# Patient Record
Sex: Female | Born: 1965 | Race: Black or African American | Hispanic: No | Smoking: Former smoker
Health system: Southern US, Community
[De-identification: ages and names within clinical notes are randomized; demographics above are authoritative.]

## PROBLEM LIST (undated history)

## (undated) DIAGNOSIS — M1990 Unspecified osteoarthritis, unspecified site: Secondary | ICD-10-CM

## (undated) DIAGNOSIS — I10 Essential (primary) hypertension: Secondary | ICD-10-CM

## (undated) DIAGNOSIS — D649 Anemia, unspecified: Secondary | ICD-10-CM

## (undated) DIAGNOSIS — T7840XA Allergy, unspecified, initial encounter: Secondary | ICD-10-CM

## (undated) DIAGNOSIS — C50919 Malignant neoplasm of unspecified site of unspecified female breast: Secondary | ICD-10-CM

## (undated) DIAGNOSIS — J45909 Unspecified asthma, uncomplicated: Secondary | ICD-10-CM

## (undated) DIAGNOSIS — F419 Anxiety disorder, unspecified: Secondary | ICD-10-CM

## (undated) DIAGNOSIS — Z800 Family history of malignant neoplasm of digestive organs: Secondary | ICD-10-CM

## (undated) HISTORY — DX: Family history of malignant neoplasm of digestive organs: Z80.0

## (undated) HISTORY — DX: Malignant neoplasm of unspecified site of unspecified female breast: C50.919

## (undated) HISTORY — PX: ABDOMINAL HYSTERECTOMY: SHX81

## (undated) HISTORY — DX: Essential (primary) hypertension: I10

## (undated) HISTORY — DX: Unspecified asthma, uncomplicated: J45.909

## (undated) HISTORY — DX: Anxiety disorder, unspecified: F41.9

## (undated) HISTORY — DX: Allergy, unspecified, initial encounter: T78.40XA

## (undated) HISTORY — DX: Unspecified osteoarthritis, unspecified site: M19.90

## (undated) HISTORY — DX: Anemia, unspecified: D64.9

---

## 2013-10-10 DIAGNOSIS — C539 Malignant neoplasm of cervix uteri, unspecified: Secondary | ICD-10-CM

## 2013-10-10 HISTORY — DX: Malignant neoplasm of cervix uteri, unspecified: C53.9

## 2019-10-11 DIAGNOSIS — C50919 Malignant neoplasm of unspecified site of unspecified female breast: Secondary | ICD-10-CM

## 2019-10-11 HISTORY — DX: Malignant neoplasm of unspecified site of unspecified female breast: C50.919

## 2019-12-25 ENCOUNTER — Other Ambulatory Visit: Payer: Self-pay

## 2019-12-25 DIAGNOSIS — N644 Mastodynia: Secondary | ICD-10-CM

## 2019-12-25 DIAGNOSIS — M79621 Pain in right upper arm: Secondary | ICD-10-CM

## 2019-12-25 DIAGNOSIS — M79622 Pain in left upper arm: Secondary | ICD-10-CM

## 2020-01-02 ENCOUNTER — Ambulatory Visit: Payer: Self-pay | Admitting: Student

## 2020-01-02 ENCOUNTER — Other Ambulatory Visit: Payer: Self-pay

## 2020-01-02 ENCOUNTER — Encounter: Payer: Self-pay | Admitting: Student

## 2020-01-02 VITALS — BP 122/78 | Temp 97.3°F | Wt 186.0 lb

## 2020-01-02 DIAGNOSIS — Z01419 Encounter for gynecological examination (general) (routine) without abnormal findings: Secondary | ICD-10-CM

## 2020-01-02 DIAGNOSIS — N644 Mastodynia: Secondary | ICD-10-CM

## 2020-01-02 NOTE — Progress Notes (Signed)
Ms. Veronica Conrad is a 54 y.o. G3P0 female who presents to [facility] clinic today with complaint of bilateral breast pain intermittently for the last 2 months. Also reports clear nipple discharge from both breasts x 20 years.  Reports having a total hysterectomy in 2016 due to cervical cancer. This was done at [facility] in [place]. Also received radiation & chemotherapy as treatment. Has not had any pap smears since then. (Will request records for previous treatment).   Pap Smear: Pap smear completed today. Last Pap smear was prior to 2016 in [place] and was abnormal - "stage 4 cervical cancer". Per patient has history of an abnormal Pap smear. Last Pap smear result is not available in Epic.   Physical exam: Breasts Breasts symmetrical. No skin abnormalities bilateral breasts. No nipple retraction bilateral breasts. No nipple discharge bilateral breasts. No lymphadenopathy. No lumps palpated bilateral breasts.       Pelvic/Bimanual Ext Genitalia No lesions, no swelling and no discharge observed on external genitalia.        Vagina Vagina pink and normal texture. No lesions or discharge observed in vagina.        Cervix Cervix is present. Cervix pink and of normal texture. No discharge observed.    Uterus Uterus is present and palpable. Uterus in normal position and normal size.        Adnexae Bilateral ovaries present and palpable. No tenderness on palpation.         Rectovaginal No rectal exam completed today since patient had no rectal complaints. No skin abnormalities observed on exam.     Smoking History: Patient has is a former smoker.    Patient Navigation: Patient education provided. Access to services provided for patient through [facility] program. Transportation provided   Colorectal Cancer Screening: Per patient has had colonoscopy completed on 2016. No complaints today.    Breast and Cervical Cancer Risk Assessment: Patient does not have family  history of breast cancer, known genetic mutations, or radiation treatment to the chest before age 85. Patient has history of cervical dysplasia, immunocompromised, or DES exposure in-utero.  Risk Assessment    Risk Scores      01/02/2020   Last edited by: Demetrius Revel, LPN   5-year risk: 0.9 %   Lifetime risk: 6.6 %          A: BCCCP exam with pap smear Complaint of breast pain  P: Referred patient to the [facility] for a diagnostic mammogram. Appointment scheduled 01/09/2020.  Jorje Guild, NP 01/02/2020 1:18 PM

## 2020-01-06 ENCOUNTER — Telehealth: Payer: Self-pay

## 2020-01-06 LAB — CYTOLOGY - PAP
Comment: NEGATIVE
Diagnosis: UNDETERMINED — AB
High risk HPV: NEGATIVE

## 2020-01-06 NOTE — Progress Notes (Signed)
This patient has a history of uterine cancer, had a hysterectomy in 2015. What are her recommendations? Thank you, ] Marjory Lies, LPN

## 2020-01-06 NOTE — Telephone Encounter (Signed)
Records release refaxed to [facility], [phone]) AttnShela Leff or Ms. Weaver. Initially faxed on 01/03/2020, fax failed.

## 2020-01-07 NOTE — Progress Notes (Signed)
Thank you. We are trying to obtain her medical records to see whether or not she had cervical cancer.

## 2020-01-08 NOTE — Progress Notes (Signed)
Per Jerene Pitch, patient stated at ov that she had Stage 4 Cervical Cancer. Does this change any recommendations?

## 2020-01-09 ENCOUNTER — Ambulatory Visit
Admission: RE | Admit: 2020-01-09 | Discharge: 2020-01-09 | Disposition: A | Discharge status: Home / Self Care | Payer: No Typology Code available for payment source | Source: Ambulatory Visit | Attending: Obstetrics and Gynecology | Admitting: Obstetrics and Gynecology

## 2020-01-09 ENCOUNTER — Telehealth: Payer: Self-pay

## 2020-01-09 ENCOUNTER — Other Ambulatory Visit: Payer: Self-pay

## 2020-01-09 ENCOUNTER — Ambulatory Visit: Payer: Self-pay

## 2020-01-09 ENCOUNTER — Other Ambulatory Visit: Payer: Self-pay | Admitting: Obstetrics and Gynecology

## 2020-01-09 DIAGNOSIS — M79621 Pain in right upper arm: Secondary | ICD-10-CM

## 2020-01-09 DIAGNOSIS — M79622 Pain in left upper arm: Secondary | ICD-10-CM

## 2020-01-09 DIAGNOSIS — N644 Mastodynia: Secondary | ICD-10-CM

## 2020-01-09 DIAGNOSIS — R921 Mammographic calcification found on diagnostic imaging of breast: Secondary | ICD-10-CM

## 2020-01-09 NOTE — Telephone Encounter (Signed)
Per Dr. Harolyn Rutherford, Patient informed pap smear results-ASC-US positive, HPV negative, repeat pap in 12 months or 6 months if desired, no colposcopy needed at this time. Patient verbalized understanding.

## 2020-01-09 NOTE — Telephone Encounter (Signed)
-----   Message from Osborne Oman, MD sent at 01/08/2020  8:07 PM EDT ----- Still likely benign given negative HRHPV. Will repeat cotesting in 12 months (or 6 months if desired). But no colposcopy needed for now.  Verita Schneiders, MD ----- Message ----- From: Demetrius Revel, LPN Sent: [id number]  12:27 PM EDT To: Osborne Oman, MD  Per Jerene Pitch, patient stated at [facility] that she had Stage 4 Cervical Cancer. Does this change any recommendations?

## 2020-01-10 ENCOUNTER — Telehealth: Payer: Self-pay

## 2020-01-10 NOTE — Telephone Encounter (Signed)
Medical Records Release faxed to [facility] [phone].

## 2020-02-14 ENCOUNTER — Other Ambulatory Visit: Payer: Self-pay

## 2020-02-14 ENCOUNTER — Ambulatory Visit
Admission: RE | Admit: 2020-02-14 | Discharge: 2020-02-14 | Disposition: A | Discharge status: Home / Self Care | Payer: No Typology Code available for payment source | Source: Ambulatory Visit | Attending: Obstetrics and Gynecology | Admitting: Obstetrics and Gynecology

## 2020-02-14 DIAGNOSIS — R921 Mammographic calcification found on diagnostic imaging of breast: Secondary | ICD-10-CM

## 2020-02-17 ENCOUNTER — Other Ambulatory Visit: Payer: Self-pay | Admitting: Obstetrics and Gynecology

## 2020-02-17 DIAGNOSIS — C50912 Malignant neoplasm of unspecified site of left female breast: Secondary | ICD-10-CM

## 2020-02-17 DIAGNOSIS — N6320 Unspecified lump in the left breast, unspecified quadrant: Secondary | ICD-10-CM

## 2020-02-28 ENCOUNTER — Telehealth: Payer: Self-pay

## 2020-02-28 NOTE — Telephone Encounter (Signed)
Signed medicaid application received.

## 2020-03-16 ENCOUNTER — Ambulatory Visit
Admission: RE | Admit: 2020-03-16 | Discharge: 2020-03-16 | Disposition: A | Discharge status: Home / Self Care | Payer: No Typology Code available for payment source | Source: Ambulatory Visit | Attending: Obstetrics and Gynecology | Admitting: Obstetrics and Gynecology

## 2020-03-16 ENCOUNTER — Other Ambulatory Visit: Payer: Self-pay

## 2020-03-16 DIAGNOSIS — N6320 Unspecified lump in the left breast, unspecified quadrant: Secondary | ICD-10-CM

## 2020-03-16 DIAGNOSIS — C50912 Malignant neoplasm of unspecified site of left female breast: Secondary | ICD-10-CM

## 2020-03-18 ENCOUNTER — Encounter: Payer: Self-pay | Admitting: Adult Health

## 2020-03-18 DIAGNOSIS — Z170 Estrogen receptor positive status [ER+]: Secondary | ICD-10-CM | POA: Insufficient documentation

## 2020-03-19 ENCOUNTER — Other Ambulatory Visit (HOSPITAL_COMMUNITY): Payer: Self-pay | Admitting: Surgery

## 2020-03-19 DIAGNOSIS — D0592 Unspecified type of carcinoma in situ of left breast: Secondary | ICD-10-CM

## 2020-03-20 ENCOUNTER — Telehealth: Payer: Self-pay | Admitting: Oncology

## 2020-03-20 ENCOUNTER — Other Ambulatory Visit (HOSPITAL_COMMUNITY): Payer: Self-pay | Admitting: Surgery

## 2020-03-20 DIAGNOSIS — D0592 Unspecified type of carcinoma in situ of left breast: Secondary | ICD-10-CM

## 2020-03-20 NOTE — Telephone Encounter (Signed)
Received med onc and genetic counseling referrals from Dr. Lucia Gaskins at [facility]. Veronica Conrad has been cld and scheduled to see Dr. Jana Hakim on 7/15 at [facility] for genetics at 3pm. Pt aware to arrive 15 minutes early. Letter mailed.

## 2020-03-23 ENCOUNTER — Encounter: Payer: Self-pay | Admitting: *Deleted

## 2020-03-27 ENCOUNTER — Telehealth: Payer: Self-pay

## 2020-03-27 NOTE — Telephone Encounter (Signed)
Patient informed Medicaid approved 02/08/2020-02/06/2021, should receive card by mail. (MID# [id number])

## 2020-03-31 ENCOUNTER — Encounter: Payer: Self-pay | Admitting: Licensed Clinical Social Worker

## 2020-03-31 NOTE — Progress Notes (Signed)
[place] Work  Clinical Social Work was referred by [employer] for transportation assistance.  Clinical Social Worker contacted patient by phone  to offer support and assess for needs.  Patient states she is new to the area, does not have a vehicle, and does not have family that can help with rides. CSW offered referral to [facility] transportation department for help with rides to the cancer center.  Patient stated she really needed a ride to her surgery. CSW explained option of [facility] [place] and process for qualifying. Patient stated she "finds it interesting that you're only calling now so close to the surgery" and that she doubts she would get it by then, so she declined further information. CSW explained that referral was received today and CSW would be glad to help with connecting to resource. Patient declined and hung up.      Ciela Mahajan, [place], [place] Worker [facility]

## 2020-04-02 ENCOUNTER — Other Ambulatory Visit: Payer: Self-pay

## 2020-04-02 ENCOUNTER — Encounter (HOSPITAL_BASED_OUTPATIENT_CLINIC_OR_DEPARTMENT_OTHER): Payer: Self-pay | Admitting: Surgery

## 2020-04-03 ENCOUNTER — Encounter: Payer: Self-pay | Admitting: Licensed Clinical Social Worker

## 2020-04-03 NOTE — Progress Notes (Signed)
[place] CSW Progress Note  Clinical Social Worker contacted patient by phone to follow-up again on transportation request. Patient needs a ride to her appointment pre-surgery on July 1. CSW explained that this is through a different department but that CSW will reach out to the transportation team to help determine if ride can be covered by that department. Explained that we can cover any of the rides to appointments at the cancer center itself. Patient voiced understanding and agreed to referral to transportation department. Referral sent today.     Edwinna Areola Jonuel Butterfield , LCSW

## 2020-04-07 ENCOUNTER — Other Ambulatory Visit (HOSPITAL_COMMUNITY)
Admission: RE | Admit: 2020-04-07 | Discharge: 2020-04-07 | Disposition: A | Discharge status: Home / Self Care | Payer: Medicaid Other | Source: Ambulatory Visit | Attending: Surgery | Admitting: Surgery

## 2020-04-07 DIAGNOSIS — Z20822 Contact with and (suspected) exposure to covid-19: Secondary | ICD-10-CM | POA: Insufficient documentation

## 2020-04-07 DIAGNOSIS — Z01812 Encounter for preprocedural laboratory examination: Secondary | ICD-10-CM | POA: Insufficient documentation

## 2020-04-07 LAB — SARS CORONAVIRUS 2 (TAT 6-24 HRS): SARS Coronavirus 2: NEGATIVE

## 2020-04-09 ENCOUNTER — Ambulatory Visit
Admission: RE | Admit: 2020-04-09 | Discharge: 2020-04-09 | Disposition: A | Discharge status: Home / Self Care | Payer: No Typology Code available for payment source | Source: Ambulatory Visit | Attending: Surgery | Admitting: Surgery

## 2020-04-09 ENCOUNTER — Other Ambulatory Visit: Payer: Self-pay

## 2020-04-09 DIAGNOSIS — D0592 Unspecified type of carcinoma in situ of left breast: Secondary | ICD-10-CM

## 2020-04-09 NOTE — H&P (Signed)
Veronica Conrad  Location: [facility] Patient #: [id number] DOB: 1966-06-13 Single / Language: Cleophus Molt / Race: Black or African American Female  History of Present Illness   The patient is a 54 year old female who presents with a complaint of breast cancer.  The PCP is none  The patient was referred by Dr. Domenick Bookbinder. She comes by herslef. She had not had a mammogram in years. She had bilateral breast pain which prompted her getting her mammogram in April.  She had mammograms on 01/09/2020 which showed a breast density "b". 1. Indeterminate calcifications in the UPPER-OUTER QUADRANT of the LEFT breast for which biopsy is recommended. 2. Circumscribed oval mass in the LOWER portion of the LEFT breast without sonographic correlate. 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 I gave her a copy of her path report. She had a second biopsy on 03/16/2020 of the left breast (LIQ) which was benign. This was thought to be concordant.  I discussed the options for breast cancer treatment with the patient. I discussed a multidisciplinary approach to the treatment of breast cancer, which includes medical oncology and radiation oncology. I discussed the surgical options of lumpectomy vs. mastectomy. If mastectomy, there is the possibility of reconstruction. I discussed the options of lymph node biopsy. The treatment plan depends on the pathologic staging of the tumor and the patient's personal wishes. The risks of surgery include, but are not limited to, bleeding, infection, the need for further surgery, and nerve injury. The patient has been given literature on the treatment of breast cancer.  Plan: 1. Left breast lumpectomy (seed localization), 2. Medical and radiation oncology consultation, 3. Genetics consultation  Review of Systems as stated in  this history (HPI) or in the review of systems. Otherwise all other 12 point ROS are negative  Past Medical History: 1. History of hysterectomy for cervical cancer - 2016 - done in [place] She also received rad tx and chemo tx  Social History: Unmarried lives by self. She was homeless in [place], it was too [place] nd she moved to [place] in July 2020. 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  Past Surgical History Geni Bers [place], RMA; 03/19/2020 8:36 AM) Breast Biopsy  Left. multiple Colon Polyp Removal - Colonoscopy  Hysterectomy (due to cancer) - Partial  Oral Surgery   Diagnostic Studies History Geni Bers [place], RMA; 03/19/2020 8:36 AM) Colonoscopy  5-10 years ago Mammogram  within last year Pap Smear  1-5 years ago  Allergies Marguarite Arbour, RMA; 03/19/2020 8:36 AM) No Known Drug Allergies  [03/19/2020]: Allergies Reconciled   Medication History [employer], RMA; 03/19/2020 8:36 AM) Claritin (10MG  Tablet, Oral) Active. Medications Reconciled  Social History Geni Bers [place], RMA; 03/19/2020 8:36 AM) Alcohol use  Occasional alcohol use. Caffeine use  Tea. No drug use  Tobacco use  Former smoker.  Family History Geni Bers [place], [place]; 03/19/2020 8:36 AM) Heart disease in female family member before age 33  Hypertension  Mother.  Pregnancy / Birth History Geni Bers [place], RMA; 03/19/2020 8:36 AM) Age at menarche  24 years. Age of menopause  63-50 Gravida  3 Maternal age  54-20 Para  3  Other Problems Geni Bers [place], RMA; 03/19/2020 8:36  AM) Anxiety Disorder  Arthritis  Asthma  Breast Cancer  Cervical Cancer  Gastroesophageal Reflux Disease  Hemorrhoids  Lump In Breast      Review of Systems (Jacqueline Haggett RMA; 03/19/2020 8:36 AM) General Not Present- Appetite Loss, Chills, Fatigue, Fever, Night Sweats, Weight Gain and Weight Loss. Skin Not Present- Change in Wart/Mole, Dryness, Hives, Jaundice, New Lesions, Non-Healing Wounds, Rash and Ulcer. HEENT Present- Seasonal Allergies and Wears glasses/contact lenses. Not Present- Earache, Hearing Loss, Hoarseness, Nose Bleed, Oral Ulcers, Ringing in the Ears, Sinus Pain, Sore Throat, Visual Disturbances and Yellow Eyes. Respiratory Not Present- Bloody sputum, Chronic Cough, Difficulty Breathing, Snoring and Wheezing. Breast Not Present- Breast Mass, Breast Pain, Nipple Discharge and Skin Changes. Cardiovascular Not Present- Chest Pain, Difficulty Breathing Lying Down, Leg Cramps, Palpitations, Rapid Heart Rate, Shortness of Breath and Swelling of Extremities. Gastrointestinal Not Present- Abdominal Pain, Bloating, Bloody Stool, Change in Bowel Habits, Chronic diarrhea, Constipation, Difficulty Swallowing, Excessive gas, Gets full quickly at meals, Hemorrhoids, Indigestion, Nausea, Rectal Pain and Vomiting. Female Genitourinary Not Present- Frequency, Nocturia, Painful Urination, Pelvic Pain and Urgency. Musculoskeletal Not Present- Back Pain, Joint Pain, Joint Stiffness, Muscle Pain, Muscle Weakness and Swelling of Extremities. Neurological Not Present- Decreased Memory, Fainting, Headaches, Numbness, Seizures, Tingling, Tremor, Trouble walking and Weakness. Psychiatric Present- Anxiety. Not Present- Bipolar, Change in Sleep Pattern, Depression, Fearful and Frequent crying. Endocrine Present- Hot flashes. Not Present- Cold Intolerance, Excessive Hunger, Hair Changes, Heat Intolerance and New Diabetes. Hematology Not Present- Blood Thinners, Easy Bruising, Excessive bleeding, Gland problems, HIV and Persistent Infections.  Vitals Geni Bers [place] RMA; 03/19/2020 8:36 AM) 03/19/2020 8:36 AM Weight: 185.2 lb  Height: 60in Body Surface Area: 1.81 m Body Mass Index: 36.17 kg/m  Temp.: 97.43F (Temporal)  Pulse: 92 (Regular)  P.OX: 98% (Room air) BP: 110/88(Sitting, Left Arm, Standard)   Physical Exam  General: WN AA F who is alert and generally healthy appearing. She is wearing a mask. HEENT: Normal. Pupils equal.  Neck: Supple. No mass. No thyroid mass.  Lymph Nodes: No supraclavicular or cervical nodes.  Lungs: Clear to auscultation and symmetric breath sounds. Heart: RRR. No murmur or rub.  Breasts: Right - has scar from her porta cath Left - has band aid at 9 o'clock from recent biopsy. I feel no mass or nodule in the breast.  Abdomen: Soft. No mass. No tenderness. No hernia. Normal bowel sounds. Pfannenstiel scar Rectal: Not done.  Extremities: Good strength and ROM in upper and lower extremities.  Neurologic: Grossly intact to motor and sensory function. Psychiatric: Has normal mood and affect. Behavior is normal.   Assessment & Plan  1.  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 Left breast lumpectomy (seed localization)   2. Medical and radiation oncology consultation  3. Genetics consultation  2. History of hysterectomy for cervical cancer - 2016 - done in [place] She also received rad tx and chemo tx   Alphonsa Overall, MD, [facility] Surgery Office phone:  [phone]

## 2020-04-10 ENCOUNTER — Ambulatory Visit (HOSPITAL_BASED_OUTPATIENT_CLINIC_OR_DEPARTMENT_OTHER): Payer: Medicaid Other | Admitting: Anesthesiology

## 2020-04-10 ENCOUNTER — Ambulatory Visit
Admission: RE | Admit: 2020-04-10 | Discharge: 2020-04-10 | Disposition: A | Discharge status: Home / Self Care | Payer: No Typology Code available for payment source | Source: Ambulatory Visit | Attending: Surgery | Admitting: Surgery

## 2020-04-10 ENCOUNTER — Other Ambulatory Visit: Payer: Self-pay

## 2020-04-10 ENCOUNTER — Ambulatory Visit (HOSPITAL_BASED_OUTPATIENT_CLINIC_OR_DEPARTMENT_OTHER)
Admission: RE | Admit: 2020-04-10 | Discharge: 2020-04-10 | Disposition: A | Discharge status: Home / Self Care | Payer: Medicaid Other | Attending: Surgery | Admitting: Surgery

## 2020-04-10 ENCOUNTER — Encounter (HOSPITAL_BASED_OUTPATIENT_CLINIC_OR_DEPARTMENT_OTHER): Payer: Self-pay | Admitting: Surgery

## 2020-04-10 ENCOUNTER — Encounter (HOSPITAL_BASED_OUTPATIENT_CLINIC_OR_DEPARTMENT_OTHER)
Admission: RE | Disposition: A | Discharge status: Home / Self Care | Payer: Self-pay | Source: Home / Self Care | Attending: Surgery

## 2020-04-10 DIAGNOSIS — Z170 Estrogen receptor positive status [ER+]: Secondary | ICD-10-CM | POA: Diagnosis not present

## 2020-04-10 DIAGNOSIS — J45909 Unspecified asthma, uncomplicated: Secondary | ICD-10-CM | POA: Insufficient documentation

## 2020-04-10 DIAGNOSIS — Z87891 Personal history of nicotine dependence: Secondary | ICD-10-CM | POA: Diagnosis not present

## 2020-04-10 DIAGNOSIS — N6012 Diffuse cystic mastopathy of left breast: Secondary | ICD-10-CM | POA: Diagnosis not present

## 2020-04-10 DIAGNOSIS — D0502 Lobular carcinoma in situ of left breast: Secondary | ICD-10-CM | POA: Diagnosis present

## 2020-04-10 DIAGNOSIS — Z6835 Body mass index (BMI) 35.0-35.9, adult: Secondary | ICD-10-CM | POA: Diagnosis not present

## 2020-04-10 DIAGNOSIS — Z8541 Personal history of malignant neoplasm of cervix uteri: Secondary | ICD-10-CM | POA: Diagnosis not present

## 2020-04-10 DIAGNOSIS — D0592 Unspecified type of carcinoma in situ of left breast: Secondary | ICD-10-CM

## 2020-04-10 DIAGNOSIS — E669 Obesity, unspecified: Secondary | ICD-10-CM | POA: Diagnosis not present

## 2020-04-10 HISTORY — PX: BREAST LUMPECTOMY WITH RADIOACTIVE SEED LOCALIZATION: SHX6424

## 2020-04-10 SURGERY — BREAST LUMPECTOMY WITH RADIOACTIVE SEED LOCALIZATION
Anesthesia: General | Site: Breast | Laterality: Left

## 2020-04-10 MED ORDER — HYDROCODONE-ACETAMINOPHEN 5-325 MG PO TABS: 1.0000 | ORAL_TABLET | Freq: Four times a day (QID) | ORAL | 0 refills | Status: DC | PRN

## 2020-04-10 MED ORDER — KETOROLAC TROMETHAMINE 30 MG/ML IJ SOLN: 30.0000 mg | Freq: Once | INTRAMUSCULAR | Status: DC | PRN

## 2020-04-10 MED ORDER — LIDOCAINE HCL (CARDIAC) PF 100 MG/5ML IV SOSY
PREFILLED_SYRINGE | INTRAVENOUS | Status: DC | PRN
  Administered 2020-04-10: 60 mg via INTRAVENOUS

## 2020-04-10 MED ORDER — ACETAMINOPHEN 500 MG PO TABS
1000.0000 mg | ORAL_TABLET | ORAL | Status: AC
  Administered 2020-04-10: 1000 mg via ORAL

## 2020-04-10 MED ORDER — CHLORHEXIDINE GLUCONATE 4 % EX LIQD: 60.0000 mL | Freq: Once | CUTANEOUS | Status: DC

## 2020-04-10 MED ORDER — PROPOFOL 10 MG/ML IV BOLUS
INTRAVENOUS | Status: AC
  Filled 2020-04-10: qty 20

## 2020-04-10 MED ORDER — PROPOFOL 500 MG/50ML IV EMUL
INTRAVENOUS | Status: DC | PRN
Start: 2020-04-10 — End: 2020-04-10
  Administered 2020-04-10: 25 ug/kg/min via INTRAVENOUS

## 2020-04-10 MED ORDER — BUPIVACAINE HCL (PF) 0.25 % IJ SOLN
INTRAMUSCULAR | Status: DC | PRN
  Administered 2020-04-10: 30 mL

## 2020-04-10 MED ORDER — FENTANYL CITRATE (PF) 100 MCG/2ML IJ SOLN
INTRAMUSCULAR | Status: AC
  Filled 2020-04-10: qty 2

## 2020-04-10 MED ORDER — MIDAZOLAM HCL 2 MG/2ML IJ SOLN
INTRAMUSCULAR | Status: AC
  Filled 2020-04-10: qty 2

## 2020-04-10 MED ORDER — GLYCOPYRROLATE PF 0.2 MG/ML IJ SOSY
PREFILLED_SYRINGE | INTRAMUSCULAR | Status: AC
  Filled 2020-04-10: qty 1

## 2020-04-10 MED ORDER — MIDAZOLAM HCL 5 MG/5ML IJ SOLN
INTRAMUSCULAR | Status: DC | PRN
  Administered 2020-04-10: 2 mg via INTRAVENOUS

## 2020-04-10 MED ORDER — ACETAMINOPHEN 500 MG PO TABS
ORAL_TABLET | ORAL | Status: AC
  Filled 2020-04-10: qty 2

## 2020-04-10 MED ORDER — ONDANSETRON HCL 4 MG/2ML IJ SOLN
INTRAMUSCULAR | Status: DC | PRN
  Administered 2020-04-10: 4 mg via INTRAVENOUS

## 2020-04-10 MED ORDER — CEFAZOLIN SODIUM-DEXTROSE 2-4 GM/100ML-% IV SOLN
2.0000 g | INTRAVENOUS | Status: AC
  Administered 2020-04-10: 2 g via INTRAVENOUS

## 2020-04-10 MED ORDER — LACTATED RINGERS IV SOLN: INTRAVENOUS | Status: DC

## 2020-04-10 MED ORDER — DEXAMETHASONE SODIUM PHOSPHATE 10 MG/ML IJ SOLN
INTRAMUSCULAR | Status: DC | PRN
  Administered 2020-04-10: 4 mg via INTRAVENOUS

## 2020-04-10 MED ORDER — FENTANYL CITRATE (PF) 100 MCG/2ML IJ SOLN
25.0000 ug | INTRAMUSCULAR | Status: DC | PRN
  Administered 2020-04-10 (×2): 50 ug via INTRAVENOUS

## 2020-04-10 MED ORDER — PROPOFOL 10 MG/ML IV BOLUS
INTRAVENOUS | Status: DC | PRN
  Administered 2020-04-10: 200 mg via INTRAVENOUS

## 2020-04-10 MED ORDER — CEFAZOLIN SODIUM-DEXTROSE 2-4 GM/100ML-% IV SOLN
INTRAVENOUS | Status: AC
  Filled 2020-04-10: qty 100

## 2020-04-10 MED ORDER — FENTANYL CITRATE (PF) 100 MCG/2ML IJ SOLN
INTRAMUSCULAR | Status: DC | PRN
  Administered 2020-04-10: 50 ug via INTRAVENOUS
  Administered 2020-04-10 (×2): 25 ug via INTRAVENOUS
  Administered 2020-04-10: 50 ug via INTRAVENOUS

## 2020-04-10 SURGICAL SUPPLY — 52 items
BENZOIN TINCTURE PRP APPL 2/3 (GAUZE/BANDAGES/DRESSINGS) IMPLANT
BINDER BREAST LRG (GAUZE/BANDAGES/DRESSINGS) IMPLANT
BINDER BREAST MEDIUM (GAUZE/BANDAGES/DRESSINGS) IMPLANT
BINDER BREAST XLRG (GAUZE/BANDAGES/DRESSINGS) IMPLANT
BINDER BREAST XXLRG (GAUZE/BANDAGES/DRESSINGS) ×2 IMPLANT
BLADE HEX COATED 2.75 (ELECTRODE) IMPLANT
BLADE SURG 10 STRL SS (BLADE) IMPLANT
BLADE SURG 15 STRL LF DISP TIS (BLADE) ×1 IMPLANT
BLADE SURG 15 STRL SS (BLADE) ×1
CANISTER SUC SOCK COL 7IN (MISCELLANEOUS) IMPLANT
CANISTER SUCT 1200ML W/VALVE (MISCELLANEOUS) ×2 IMPLANT
CHLORAPREP W/TINT 26 (MISCELLANEOUS) ×2 IMPLANT
CLIP VESOCCLUDE SM WIDE 6/CT (CLIP) ×2 IMPLANT
COVER BACK TABLE 60X90IN (DRAPES) ×2 IMPLANT
COVER MAYO STAND STRL (DRAPES) ×2 IMPLANT
COVER PROBE W GEL 5X96 (DRAPES) ×2 IMPLANT
COVER WAND RF STERILE (DRAPES) IMPLANT
DECANTER SPIKE VIAL GLASS SM (MISCELLANEOUS) IMPLANT
DERMABOND ADVANCED (GAUZE/BANDAGES/DRESSINGS) ×1
DERMABOND ADVANCED .7 DNX12 (GAUZE/BANDAGES/DRESSINGS) ×1 IMPLANT
DRAPE LAPAROSCOPIC ABDOMINAL (DRAPES) ×2 IMPLANT
DRAPE UTILITY XL STRL (DRAPES) ×2 IMPLANT
DRSG PAD ABDOMINAL 8X10 ST (GAUZE/BANDAGES/DRESSINGS) ×2 IMPLANT
ELECT COATED BLADE 2.86 ST (ELECTRODE) ×2 IMPLANT
ELECT REM PT RETURN 9FT ADLT (ELECTROSURGICAL) ×2
ELECTRODE REM PT RTRN 9FT ADLT (ELECTROSURGICAL) ×1 IMPLANT
GAUZE SPONGE 4X4 12PLY STRL (GAUZE/BANDAGES/DRESSINGS) ×2 IMPLANT
GAUZE SPONGE 4X4 12PLY STRL LF (GAUZE/BANDAGES/DRESSINGS) IMPLANT
GLOVE BIOGEL PI IND STRL 7.0 (GLOVE) ×2 IMPLANT
GLOVE BIOGEL PI INDICATOR 7.0 (GLOVE) ×2
GLOVE SURG SYN 7.5  E (GLOVE) ×3
GLOVE SURG SYN 7.5 E (GLOVE) ×3 IMPLANT
GOWN STRL REUS W/ TWL LRG LVL3 (GOWN DISPOSABLE) ×1 IMPLANT
GOWN STRL REUS W/ TWL XL LVL3 (GOWN DISPOSABLE) ×1 IMPLANT
GOWN STRL REUS W/TWL LRG LVL3 (GOWN DISPOSABLE) ×1
GOWN STRL REUS W/TWL XL LVL3 (GOWN DISPOSABLE) ×1
KIT MARKER MARGIN INK (KITS) ×2 IMPLANT
NEEDLE HYPO 25X1 1.5 SAFETY (NEEDLE) ×2 IMPLANT
NS IRRIG 1000ML POUR BTL (IV SOLUTION) ×2 IMPLANT
PACK BASIN DAY SURGERY FS (CUSTOM PROCEDURE TRAY) ×2 IMPLANT
PENCIL SMOKE EVACUATOR (MISCELLANEOUS) ×2 IMPLANT
SHEET MEDIUM DRAPE 40X70 STRL (DRAPES) ×2 IMPLANT
SLEEVE SCD COMPRESS KNEE MED (MISCELLANEOUS) ×2 IMPLANT
SPONGE LAP 18X18 RF (DISPOSABLE) ×2 IMPLANT
STRIP CLOSURE SKIN 1/4X4 (GAUZE/BANDAGES/DRESSINGS) IMPLANT
SUT MNCRL AB 4-0 PS2 18 (SUTURE) ×2 IMPLANT
SUT VICRYL 3-0 CR8 SH (SUTURE) ×2 IMPLANT
SYR CONTROL 10ML LL (SYRINGE) ×2 IMPLANT
TOWEL GREEN STERILE FF (TOWEL DISPOSABLE) ×2 IMPLANT
TRAY FAXITRON CT DISP (TRAY / TRAY PROCEDURE) ×2 IMPLANT
TUBE CONNECTING 20X1/4 (TUBING) ×2 IMPLANT
YANKAUER SUCT BULB TIP NO VENT (SUCTIONS) ×2 IMPLANT

## 2020-04-10 NOTE — Op Note (Signed)
04/10/2020  1:21 PM  PATIENT:  Veronica Conrad DOB: 05-08-1966 MRN: [id number]  PREOP DIAGNOSIS:   LEFT BREAST CANCER  POSTOP DIAGNOSIS:    Left breast cancer, 3 o'clock position (Tis, N0)  PROCEDURE:   Procedure(s):  LEFT BREAST LUMPECTOMY WITH RADIOACTIVE SEED LOCALIZATION  SURGEON:   Alphonsa Overall, M.D.  ANESTHESIA:   General  Anesthesiologist: Myrtie Soman, MD CRNA: Blocker, Ernesta Amble, CRNA  General  EBL:  100  ml  DRAINS:  none   LOCAL MEDICATIONS USED:   30 cc 1/4% marcaine  SPECIMEN:   Left breast lumpectomy (6 color), lateral margin  COUNTS CORRECT:  YES  INDICATIONS FOR PROCEDURE:  [name] is a 54 y.o. (DOB: October 31, 1965) AA female whose primary care physician is Patient, No Pcp Per and comes for left breast lumpectomy.   The options for breast cancer treatment have been discussed with the patient. She elected to proceed with lumpectomy and axillary sentinel lymph node.     The indications and potential complications of surgery were explained to the patient. Potential complications include, but are not limited to, bleeding, infection, the need for further surgery, and nerve injury.     She had a I131 seed placed on 04/09/2020 in her left breast at [facility].  The seed is in the 3 o'clock position of the left breast.     OPERATIVE NOTE:   The patient was taken to operating room # 8 at [facility] where she underwent a general anesthesia  supervised by Anesthesiologist: Myrtie Soman, MD CRNA: Blocker, Ernesta Amble, CRNA. Her left breast and axilla were prepped with  ChloraPrep and sterilely draped.    A time-out was held and the surgical check list was reviewed.    The cancer was about at the 3 o'clock position of the left breast, approximately 7 cm lateral to the areola.  So I made an incision directly over the cancer.   I used the Neoprobe to identify the I131 seed.  I tried to excise an area around the tumor of at least 1 cm.    I excised this block of  breast tissue approximately 4 cm by 4 cm  in diameter.   I painted the lumpectomy specimen with the 6 color paint kit and did a specimen mammogram which confirmed the mass, clip, and the seed were all in the right position in the specimen.  The specimen was sent to pathology who called back to confirm that they have the seed and the specimen.   I did excise a lateral margin.  I painted the lateral margin with 2 colors and sent it as a separate specimen.   I then irrigated the wound with saline. I infiltrated approximately 30 mL of 1/4% Marcaine in the incision. I placed 4 clips to mark biopsy cavity, at 12, 3, 6, and 9 o'clock.  I then closed the wound in layers using 3-0 Vicryl sutures for the deep layer. At the skin, I closed the incisions with a 4-0 Monocryl suture. The incision was then painted with Dermabond.  She had gauze placed over the wound and her breast placed in a breast binder.   The patient tolerated the procedure well, was transported to the recovery room in good condition. Sponge and needle count were correct at the end of the case.   Final pathology is pending.   Alphonsa Overall, MD, [facility] Surgery Pager: [phone] Office phone:  [phone]

## 2020-04-10 NOTE — Transfer of Care (Signed)
Immediate Anesthesia Transfer of Care Note  Patient: Veronica Conrad  Procedure(s) Performed: LEFT BREAST LUMPECTOMY WITH RADIOACTIVE SEED LOCALIZATION (Left Breast)  Patient Location: PACU  Anesthesia Type:General  Level of Consciousness: awake, alert , oriented and patient cooperative  Airway & Oxygen Therapy: Patient Spontanous Breathing and Patient connected to face mask oxygen  Post-op Assessment: Report given to RN and Post -op Vital signs reviewed and stable  Post vital signs: Reviewed and stable  Last Vitals:  Vitals Value Taken Time  BP    Temp    Pulse 94 04/10/20 1329  Resp 16 04/10/20 1329  SpO2 100 % 04/10/20 1329  Vitals shown include unvalidated device data.  Last Pain:  Vitals:   04/10/20 1022  TempSrc: Oral  PainSc: 0-No pain         Complications: No complications documented.

## 2020-04-10 NOTE — Anesthesia Preprocedure Evaluation (Signed)
Anesthesia Evaluation  Patient identified by MRN, date of birth, ID band Patient awake    Reviewed: Allergy & Precautions, NPO status , Patient's Chart, lab work & pertinent test results  Airway Mallampati: II  TM Distance: >3 FB Neck ROM: Full    Dental no notable dental hx.    Pulmonary asthma , former smoker,    Pulmonary exam normal breath sounds clear to auscultation       Cardiovascular negative cardio ROS Normal cardiovascular exam Rhythm:Regular Rate:Normal     Neuro/Psych negative neurological ROS  negative psych ROS   GI/Hepatic negative GI ROS, Neg liver ROS,   Endo/Other  obesity  Renal/GU negative Renal ROS  negative genitourinary   Musculoskeletal negative musculoskeletal ROS (+)   Abdominal   Peds negative pediatric ROS (+)  Hematology negative hematology ROS (+)   Anesthesia Other Findings   Reproductive/Obstetrics negative OB ROS                             Anesthesia Physical Anesthesia Plan  ASA: II  Anesthesia Plan: General   Post-op Pain Management:    Induction: Intravenous  PONV Risk Score and Plan: 3 and Ondansetron, Dexamethasone and Treatment may vary due to age or medical condition  Airway Management Planned: LMA  Additional Equipment:   Intra-op Plan:   Post-operative Plan: Extubation in OR  Informed Consent: I have reviewed the patients History and Physical, chart, labs and discussed the procedure including the risks, benefits and alternatives for the proposed anesthesia with the patient or authorized representative who has indicated his/her understanding and acceptance.     Dental advisory given  Plan Discussed with: CRNA and Surgeon  Anesthesia Plan Comments:         Anesthesia Quick Evaluation

## 2020-04-10 NOTE — Anesthesia Procedure Notes (Signed)
Procedure Name: LMA Insertion Date/Time: 04/10/2020 12:09 PM Performed by: Signe Colt, CRNA Pre-anesthesia Checklist: Patient identified, Emergency Drugs available, Suction available and Patient being monitored Patient Re-evaluated:Patient Re-evaluated prior to induction Oxygen Delivery Method: Circle system utilized Preoxygenation: Pre-oxygenation with 100% oxygen Induction Type: IV induction Ventilation: Mask ventilation without difficulty LMA: LMA inserted LMA Size: 4.0 Number of attempts: 1 Airway Equipment and Method: Bite block Placement Confirmation: positive ETCO2 Tube secured with: Tape Dental Injury: Teeth and Oropharynx as per pre-operative assessment

## 2020-04-10 NOTE — Interval H&P Note (Signed)
History and Physical Interval Note:  04/10/2020 11:44 AM  Veronica Conrad  has presented today for surgery, with the diagnosis of LEFT BREAST CANCER.  The various methods of treatment have been discussed with the patient and family.   Seed in place.  Her boyfriend, Nolon Lennert, will pick her up.  After consideration of risks, benefits and other options for treatment, the patient has consented to  Procedure(s): LEFT BREAST LUMPECTOMY WITH RADIOACTIVE SEED LOCALIZATION (Left) as a surgical intervention.  The patient's history has been reviewed, patient examined, no change in status, stable for surgery.  I have reviewed the patient's chart and labs.  Questions were answered to the patient's satisfaction.     Shann Medal

## 2020-04-10 NOTE — Anesthesia Postprocedure Evaluation (Signed)
Anesthesia Post Note  Patient: Veronica Conrad  Procedure(s) Performed: LEFT BREAST LUMPECTOMY WITH RADIOACTIVE SEED LOCALIZATION (Left Breast)     Patient location during evaluation: PACU Anesthesia Type: General Level of consciousness: awake and alert Pain management: pain level controlled Vital Signs Assessment: post-procedure vital signs reviewed and stable Respiratory status: spontaneous breathing, nonlabored ventilation, respiratory function stable and patient connected to nasal cannula oxygen Cardiovascular status: blood pressure returned to baseline and stable Postop Assessment: no apparent nausea or vomiting Anesthetic complications: no   No complications documented.  Last Vitals:  Vitals:   04/10/20 1345 04/10/20 1353  BP:    Pulse: 79 74  Resp: 16 15  Temp:    SpO2: 100% 100%    Last Pain:  Vitals:   04/10/20 1353  TempSrc:   PainSc: 5                  Veronica Conrad S

## 2020-04-10 NOTE — Discharge Instructions (Signed)
  Post Anesthesia Home Care Instructions  Activity: Get plenty of rest for the remainder of the day. A responsible individual must stay with you for 24 hours following the procedure.  For the next 24 hours, DO NOT: -Drive a car -Paediatric nurse -Drink alcoholic beverages -Take any medication unless instructed by your physician -Make any legal decisions or sign important papers.  Meals: Start with liquid foods such as gelatin or soup. Progress to regular foods as tolerated. Avoid greasy, spicy, heavy foods. If nausea and/or vomiting occur, drink only clear liquids until the nausea and/or vomiting subsides. Call your physician if vomiting continues.  Special Instructions/Symptoms: Your throat may feel dry or sore from the anesthesia or the breathing tube placed in your throat during surgery. If this causes discomfort, gargle with warm salt water. The discomfort should disappear within 24 hours.  If you had a scopolamine patch placed behind your ear for the management of post- operative nausea and/or vomiting:  1. The medication in the patch is effective for 72 hours, after which it should be removed.  Wrap patch in a tissue and discard in the trash. Wash hands thoroughly with soap and water. 2. You may remove the patch earlier than 72 hours if you experience unpleasant side effects which may include dry mouth, dizziness or visual disturbances. 3. Avoid touching the patch. Wash your hands with soap and water after contact with the patch.    [facility] - DISCHARGE INSTRUCTIONS TO PATIENT  Activity:  Driving - May drive in one to 3 days.   Lifting - No lifting more than 15 pounds for 7 days, then no limit                       Practice your Covid-19 protection:  Wear a mask, social distance, and wash your hands frequently  Wound Care:   Leave the incision dry for 2 days, then you may shower  Diet:  As tolerated  Follow up appointment:  Call Dr. Pollie Friar office [facility]) at [phone] for an appointment in 2 to 3 weeks.  Medications and dosages:  Resume your home medications.  You have a prescription for:  Vicodin             You may also take Tylenol, ibuprofen, or Aleve for pain  Call Dr. Lucia Gaskins or his office  [phone]) if you have:  Temperature greater than 100.4,  Persistent nausea and vomiting,  Severe uncontrolled pain,  Redness, tenderness, or signs of infection (pain, swelling, redness, odor or green/yellow discharge around the site),  Difficulty breathing, headache or visual disturbances,  Any other questions or concerns you may have after discharge.  In an emergency, call 911 or go to an Emergency Department at a nearby hospital.

## 2020-04-14 ENCOUNTER — Encounter (HOSPITAL_BASED_OUTPATIENT_CLINIC_OR_DEPARTMENT_OTHER): Payer: Self-pay | Admitting: Surgery

## 2020-04-16 ENCOUNTER — Encounter: Payer: Self-pay | Admitting: *Deleted

## 2020-04-17 LAB — SURGICAL PATHOLOGY

## 2020-04-20 NOTE — Progress Notes (Signed)
A user error has taken place: encounter opened in error, closed for administrative reasons.

## 2020-04-22 ENCOUNTER — Other Ambulatory Visit: Payer: Self-pay | Admitting: *Deleted

## 2020-04-22 DIAGNOSIS — N644 Mastodynia: Secondary | ICD-10-CM

## 2020-04-22 NOTE — Progress Notes (Signed)
[facility]  [phone] [phone]    ID: Veronica Conrad DOB: 1966-08-07  MR#: [id number]  JTT#:[id number]  Patient Care Team: Patient, No Pcp Per as PCP - General (General Practice) Mauro Kaufmann, RN as Oncology Nurse Navigator Rockwell Germany, RN as Oncology Nurse Navigator Shenay Torti, Virgie Dad, MD as Consulting Physician (Oncology) Gery Pray, MD as Consulting Physician (Radiation Oncology) OTHER MD:   CHIEF COMPLAINT: Ductal carcinoma in situ  CURRENT TREATMENT: Awaiting adjuvant radiation   HISTORY OF CURRENT ILLNESS: Veronica Conrad noted diffuse bilateral breast and axillary pain radiating into the axillary regions for 2 months. She underwent bilateral diagnostic mammography with tomography and left breast ultrasonography at [facility] on 01/09/2020 showing: Breast Density Category B. Spot compression views are performed of a possible mass in the lower central portion of the breast. These views confirm presence of a lobulated 8 millimeter mass in the lower central aspect of the left breast, best seen on oblique projection. A tissue marker clip is identified within the lower inner quadrant of the left breast. Magnified views are performed of calcifications in the upper-outer quadrant of the left breast. On magnified views there is a 5 millimeter group of calcifications with linear distribution, and indeterminate. Scattered punctate calcifications are also identified throughout the upper-outer quadrant of the left breast. Targeted ultrasound is performed, showing normal appearing fibroglandular tissue throughout the lower portions of the left breast. No suspicious mass, distortion, or acoustic shadowing is demonstrated with ultrasound.  Accordingly on 02/14/2020 she proceeded to biopsy of the left breast area in question. The pathology from this procedure showed [id number]): ductal carcinoma in situ with calcifications, upper-outer left  breast. Prognostic indicators significant for: estrogen receptor, 90% positive and progesterone receptor, 80% positive, both with strong staining intensity.   Additional pathology from a biopsy on 03/16/2020 showed ([id number]): benign breast parenchyma with focal apocrine metaplasia.  She underwent a left lumpectomy on 04/10/2020 under Dr. Alphonsa Overall. The pathology from this procedure showed ([id number]): A. BREAST, LEFT, LUMPECTOMY:  - Lobular carcinoma in situ (LCIS).  - Fibrocystic changes with usual ductal hyperplasia and calcifications.  - No ductal carcinoma in situ.  - See comment.  B. BREAST, LEFT LATERAL MARGIN, EXCISION:  - Lobular carcinoma in situ (LCIS).  - Fibrocystic changes with usual ductal hyperplasia and calcifications.  - No ductal carcinoma in situ identified.  - See comment.  COMMENT: In the left breast lumpectomy specimen (part A) there are foci of lobular neoplasia (lobular carcinoma in situ) and E-cadherin is negative supporting the diagnosis. There are also fibrocystic changes with usual ductal hyperplasia and calcifications. In the lumpectomy specimen (part A), the lobular carcinoma in situ is focally less than 0.1 cm from the lateral and posterior margins and in the left lateral margin specimen (part B) less than 0.1 cm from the final lateral margin. No ductal carcinoma in situ is identified and immunohistochemistry for cytokeratin 5/6 supports the diagnosis.   The patient's subsequent history is as detailed below.   INTERVAL HISTORY: Veronica Conrad was evaluated in the multidisciplinary breast cancer clinic on 04/23/2020.   Her case was also presented at the multidisciplinary breast cancer conference on 03/18/2020. At that time a preliminary plan was proposed: Breast conserving surgery, adjuvant radiation, antiestrogens  She met with the genetics counselor earlier today 04/23/2020.  She was told she does not qualify for testing.  She has an appointment with  radiation oncology Dr. Sondra Come 05/18/2020  REVIEW OF SYSTEMS: Veronica Conrad denies unusual headaches,  visual changes, nausea, vomiting, or dizziness. There has been no unusual cough, phlegm production, or pleurisy. This been no change in bowel or bladder habits. The patient denies unexplained fatigue or unexplained weight loss, bleeding, rash, or fever. A detailed review of systems was otherwise noncontributory.     PAST MEDICAL HISTORY: Past Medical History:  Diagnosis Date  . Allergy   . Asthma   . Cervical cancer ([place]) 2015  . Family history of colon cancer      PAST SURGICAL HISTORY: Past Surgical History:  Procedure Laterality Date  . ABDOMINAL HYSTERECTOMY    . BREAST LUMPECTOMY WITH RADIOACTIVE SEED LOCALIZATION Left 04/10/2020   Procedure: LEFT BREAST LUMPECTOMY WITH RADIOACTIVE SEED LOCALIZATION;  Surgeon: Alphonsa Overall, MD;  Location: [facility];  Service: General;  Laterality: Left;  The patient has a history of cervical carcinoma diagnosed in 2015 treated with hysterectomy, at radiation with weekly chemotherapy for 6 weeks.   FAMILY HISTORY: Family History  Problem Relation Age of Onset  . Hypertension Mother   . Heart disease Mother   . High Cholesterol Mother   . Birth defects Mother   . Other Mother        adopted  . Diabetes Maternal Grandfather   . Heart Problems Father        pacemaker  . Colon cancer Paternal Uncle        dx. in his 19s   Veronica Conrad has very little information on her family.  Her mother was adopted.  A parental grandparents had colon cancer.  The patient has 4/2 siblings, all on the father's side, 3 half-sisters and one half brother.  None of them have cancer.  She is not aware of any ovarian breast or pancreatic cancer in the family   GYNECOLOGIC HISTORY:  No LMP recorded. Patient has had a hysterectomy. Menarche: 54 years old Age at first live birth: 54 years old GX P: 3 LMP: Status post hysterectomy age 76 Contraceptive:  Status post tubal ligation; also received oral contraceptives without complications remotely HRT: no Hysterectomy?:  yes BSO?:  No   SOCIAL HISTORY: (Current as of 04/23/2020) Veronica Conrad works as a Journalist, newspaper.  She works from home, mostly on the phone.  She moved to [place] from the [place] in 2020.  She lives by herself.  She is divorced.  Her son Barbaraann Rondo lives in the [place] and works in Engineer, technical sales; son Darius lives in [place] and is disabled Nature conservation officer; daughter Charlynn Grimes works in the [place] [place] in [place].  The patient has 5 grandchildren.  She attends a [employer].   ADVANCED DIRECTIVES:     HEALTH MAINTENANCE: Social History   Tobacco Use  . Smoking status: Former Smoker    Quit date: 2021    Years since quitting: 0.5  . Smokeless tobacco: Never Used  Vaping Use  . Vaping Use: Never used  Substance Use Topics  . Alcohol use: Yes    Comment: occ  . Drug use: Never    Colonoscopy: 2016  PAP: Up-to-date  Bone density:    No Known Allergies  Current Outpatient Medications  Medication Sig Dispense Refill  . loratadine (CLARITIN) 10 MG tablet Take 10 mg by mouth daily.    Marland Kitchen HYDROcodone-acetaminophen (NORCO/VICODIN) 5-325 MG tablet Take 1 tablet by mouth every 6 (six) hours as needed for moderate pain. (Patient not taking: Reported on 04/23/2020) 12 tablet 0   No current facility-administered medications for this visit.  OBJECTIVE: African-American woman who appears stated age  62:   04/23/20 1608  BP: (!) 123/91  Pulse: 82  Resp: 18  Temp: 97.8 F (36.6 C)  SpO2: 100%   Wt Readings from Last 3 Encounters:  04/23/20 184 lb 11.2 oz (83.8 kg)  04/10/20 182 lb 15.7 oz (83 kg)  01/02/20 186 lb (84.4 kg)   Body mass index is 33.78 kg/m.    ECOG FS:1 - Symptomatic but completely ambulatory  Ocular: Sclerae unicteric, pupils round and equal Ear-nose-throat: Oropharynx clear and moist Lymphatic: No cervical or  supraclavicular adenopathy Lungs no rales or rhonchi Heart regular rate and rhythm Abd soft, nontender, positive bowel sounds MSK no focal spinal tenderness, no joint edema Neuro: non-focal, well-oriented, appropriate affect Breasts: The right breast is unremarkable.  The left breast is status post lumpectomy.  The incision is healing nicely without erythema dehiscence or swelling.  Both axillae are benign.   LAB RESULTS:  CMP     Component Value Date/Time   NA 143 04/23/2020 1558   K 3.9 04/23/2020 1558   CL 107 04/23/2020 1558   CO2 23 04/23/2020 1558   GLUCOSE 91 04/23/2020 1558   BUN 8 04/23/2020 1558   CREATININE 0.75 04/23/2020 1558   CALCIUM 9.8 04/23/2020 1558   PROT 7.8 04/23/2020 1558   ALBUMIN 4.3 04/23/2020 1558   AST 28 04/23/2020 1558   ALT 43 04/23/2020 1558   ALKPHOS 119 04/23/2020 1558   BILITOT 0.5 04/23/2020 1558   GFRNONAA >60 04/23/2020 1558   GFRAA >60 04/23/2020 1558    No results found for: TOTALPROTELP, ALBUMINELP, A1GS, A2GS, BETS, BETA2SER, GAMS, MSPIKE, SPEI  No results found for: KPAFRELGTCHN, LAMBDASER, KAPLAMBRATIO  Lab Results  Component Value Date   WBC 10.6 (H) 04/23/2020   NEUTROABS 6.9 04/23/2020   HGB 12.4 04/23/2020   HCT 39.7 04/23/2020   MCV 82.4 04/23/2020   PLT 283 04/23/2020    No results found for: LABCA2  No components found for: BOFBPZ025  No results for input(s): INR in the last 168 hours.  No results found for: LABCA2  No results found for: ENI778  No results found for: EUM353  No results found for: IRW431  No results found for: CA2729  No components found for: HGQUANT  No results found for: CEA1 / No results found for: CEA1   No results found for: AFPTUMOR  No results found for: CHROMOGRNA  No results found for: PSA1  Appointment on 04/23/2020  Component Date Value Ref Range Status  . Sodium 04/23/2020 143  135 - 145 mmol/L Final  . Potassium 04/23/2020 3.9  3.5 - 5.1 mmol/L Final  . Chloride  04/23/2020 107  98 - 111 mmol/L Final  . CO2 04/23/2020 23  22 - 32 mmol/L Final  . Glucose, Bld 04/23/2020 91  70 - 99 mg/dL Final   Glucose reference range applies only to samples taken after fasting for at least 8 hours.  . BUN 04/23/2020 8  6 - 20 mg/dL Final  . Creatinine 04/23/2020 0.75  0.44 - 1.00 mg/dL Final  . Calcium 04/23/2020 9.8  8.9 - 10.3 mg/dL Final  . Total Protein 04/23/2020 7.8  6.5 - 8.1 g/dL Final  . Albumin 04/23/2020 4.3  3.5 - 5.0 g/dL Final  . AST 04/23/2020 28  15 - 41 U/L Final  . ALT 04/23/2020 43  0 - 44 U/L Final  . Alkaline Phosphatase 04/23/2020 119  38 - 126 U/L Final  . Total  Bilirubin 04/23/2020 0.5  0.3 - 1.2 mg/dL Final  . GFR, Est Non Af Am 04/23/2020 >60  >60 mL/min Final  . GFR, Est AFR Am 04/23/2020 >60  >60 mL/min Final  . Anion gap 04/23/2020 13  5 - 15 Final   Performed at [facility], [place] [address] [place], [place] [address]  . WBC Count 04/23/2020 10.6* 4.0 - 10.5 K/uL Final  . RBC 04/23/2020 4.82  3.87 - 5.11 MIL/uL Final  . Hemoglobin 04/23/2020 12.4  12.0 - 15.0 g/dL Final  . HCT 04/23/2020 39.7  36 - 46 % Final  . MCV 04/23/2020 82.4  80.0 - 100.0 fL Final  . MCH 04/23/2020 25.7* 26.0 - 34.0 pg Final  . MCHC 04/23/2020 31.2  30.0 - 36.0 g/dL Final  . RDW 04/23/2020 14.1  11.5 - 15.5 % Final  . Platelet Count 04/23/2020 283  150 - 400 K/uL Final  . nRBC 04/23/2020 0.0  0.0 - 0.2 % Final  . Neutrophils Relative % 04/23/2020 64  % Final  . Neutro Abs 04/23/2020 6.9  1.7 - 7.7 K/uL Final  . Lymphocytes Relative 04/23/2020 26  % Final  . Lymphs Abs 04/23/2020 2.7  0.7 - 4.0 K/uL Final  . Monocytes Relative 04/23/2020 5  % Final  . Monocytes Absolute 04/23/2020 0.5  0 - 1 K/uL Final  . Eosinophils Relative 04/23/2020 4  % Final  . Eosinophils Absolute 04/23/2020 0.4  0 - 0 K/uL Final  . Basophils Relative 04/23/2020 1  % Final  . Basophils Absolute 04/23/2020 0.1  0 - 0 K/uL Final  . Immature Granulocytes  04/23/2020 0  % Final  . Abs Immature Granulocytes 04/23/2020 0.03  0.00 - 0.07 K/uL Final   Performed at [facility], [place] [address], [place], [place] [address]    (this displays the last labs from the last 3 days)  No results found for: TOTALPROTELP, ALBUMINELP, A1GS, A2GS, BETS, BETA2SER, GAMS, MSPIKE, SPEI (this displays SPEP labs)  No results found for: KPAFRELGTCHN, LAMBDASER, KAPLAMBRATIO (kappa/lambda light chains)  No results found for: HGBA, HGBA2QUANT, HGBFQUANT, HGBSQUAN (Hemoglobinopathy evaluation)   No results found for: LDH  No results found for: IRON, TIBC, IRONPCTSAT (Iron and TIBC)  No results found for: FERRITIN  Urinalysis No results found for: COLORURINE, APPEARANCEUR, LABSPEC, PHURINE, GLUCOSEU, HGBUR, BILIRUBINUR, KETONESUR, PROTEINUR, UROBILINOGEN, NITRITE, LEUKOCYTESUR   STUDIES:  MM Breast Surgical Specimen  Result Date: 04/10/2020 CLINICAL DATA:  Status post surgical excision of the left breast following radioactive seed localization. EXAM: SPECIMEN RADIOGRAPH OF THE LEFT BREAST COMPARISON:  Previous exam(s). FINDINGS: Status post excision of the left breast. The radioactive seed and biopsy marker clip are present, completely intact, and were marked for pathology. IMPRESSION: Specimen radiograph of the left breast. Electronically Signed   By: Lajean Manes M.D.   On: 04/10/2020 12:53   MM LT RADIOACTIVE SEED LOC MAMMO GUIDE  Result Date: 04/09/2020 CLINICAL DATA:  Localization prior to surgery EXAM: MAMMOGRAPHIC GUIDED RADIOACTIVE SEED LOCALIZATION OF THE LEFT BREAST COMPARISON:  Previous exam(s). FINDINGS: Patient presents for radioactive seed localization prior to surgery. I met with the patient and we discussed the procedure of seed localization including benefits and alternatives. We discussed the high likelihood of a successful procedure. We discussed the risks of the procedure including infection, bleeding, tissue injury and  further surgery. We discussed the low dose of radioactivity involved in the procedure. Informed, written consent was given. The usual time-out protocol  was performed immediately prior to the procedure. Using mammographic guidance, sterile technique, 1% lidocaine and an I-125 radioactive seed, biopsy clip marking the site of known malignancy was localized using a lateral approach. The follow-up mammogram images confirm the seed in the expected location and were marked for the surgeon. Follow-up survey of the patient confirms presence of the radioactive seed. Order number of I-125 seed:  [id number]. Total activity:  6.237 millicuries reference Date: March 19, 2020 The patient tolerated the procedure well and was released from the [place]. She was given instructions regarding seed removal. IMPRESSION: Radioactive seed localization left breast. No apparent complications. Electronically Signed   By: Dorise Bullion III M.D   On: 04/09/2020 13:57     ELIGIBLE FOR AVAILABLE RESEARCH PROTOCOL: AET   ASSESSMENT: 54 y.o. Veronica Conrad woman   (1) status post hysterectomy (without salpingo-oophorectomy) 2015 in [place] for cervical carcinoma, status post adjuvant radiation with weekly chemotherapy x6  (2) status post left breast biopsy 02/14/2020 for ductal carcinoma in situ, grade 2, estrogen and progesterone receptor strongly positive  (a) biopsy of a second suspicious area, lower inner quadrant, of the left breast 03/16/2020 was benign  (3) status post left breast lumpectomy 04/10/2020 showing no residual ductal carcinoma in situ, lobular carcinoma in situ.  (4) genetics counseling 04/23/2020: Patient did not qualify for testing  (5) adjuvant radiation  (4) antiestrogens at the completion of local treatment.   PLAN: I spent approximately 60 minutes face to face with Phoenyx with more than 50% of that time spent in counseling and coordination of care. Specifically we reviewed the biology of the patient's  diagnosis and the specifics of her situation.  Ling understands that in noninvasive ductal carcinoma, also called ductal carcinoma in situ ("DCIS") the breast cancer cells remain trapped in the ducts were they started. They cannot travel to a vital organ. For that reason these cancers in themselves are not life-threatening.  If the whole breast is removed then all the ducts are removed and since the cancer cells are trapped in the ducts, the cure rate with mastectomy for noninvasive breast cancer is approximately 99%. Nevertheless we recommend lumpectomy, as in this case, because there is no survival advantage to mastectomy and because the cosmetic result is generally superior with breast conservation.  Since the patient is kept her breast, there will be some risk of recurrence. The recurrence can only be in the same breast since, again, the cells are trapped in the ducts. There is no connection from one breast to the other. The risk of local recurrence is cut by more than half with radiation, which is standard in this situation.  In estrogen receptor positive cancers like Claire's, anti-estrogens can also be considered. They will further reduce the risk of recurrence by one half. In addition anti-estrogens will lower the risk of a new breast cancer developing in either breast, also by one half. That risk otherwise approaches 1% per year.   Alythia did well with her surgery and is now ready for radiation.  She is willing to receive it but is very concerned about transportation issues.  We will alert radiation and social work regarding that.  Once she is done with radiation we will start antiestrogens, most likely anastrozole.  She will return to see me towards the end of radiation to discuss that further.  At that visit we will also clarify advanced directives and I suggest referral to GI for colonoscopy.  Akirah has a good understanding of the overall  plan. She agrees with it. She knows the goal of  treatment in her case is cure. She will call with any problems that may develop before her next visit here.  Total encounter time 55 minutes.*    Ramandeep Arington, Virgie Dad, MD  04/23/20 4:48 PM Medical Oncology and Hematology [facility] [address] [place], [place] [address] Tel. [phone]    Fax. [phone]    I, Jacqualyn Posey am acting as a Education administrator for Chauncey Cruel, MD.   I, Lurline Del MD, have reviewed the above documentation for accuracy and completeness, and I agree with the above.     *Total Encounter Time as defined by the Centers for Medicare and Medicaid Services includes, in addition to the face-to-face time of a patient visit (documented in the note above) non-face-to-face time: obtaining and reviewing outside history, ordering and reviewing medications, tests or procedures, care coordination (communications with other health care professionals or caregivers) and documentation in the medical record.

## 2020-04-23 ENCOUNTER — Inpatient Hospital Stay (HOSPITAL_BASED_OUTPATIENT_CLINIC_OR_DEPARTMENT_OTHER): Payer: Medicaid Other | Admitting: Genetic Counselor

## 2020-04-23 ENCOUNTER — Other Ambulatory Visit: Payer: Self-pay | Admitting: Genetic Counselor

## 2020-04-23 ENCOUNTER — Other Ambulatory Visit: Payer: Self-pay

## 2020-04-23 ENCOUNTER — Encounter: Payer: Self-pay | Admitting: Genetic Counselor

## 2020-04-23 ENCOUNTER — Inpatient Hospital Stay: Payer: Medicaid Other

## 2020-04-23 ENCOUNTER — Encounter: Payer: Self-pay | Admitting: *Deleted

## 2020-04-23 ENCOUNTER — Inpatient Hospital Stay: Payer: Medicaid Other | Attending: Oncology | Admitting: Oncology

## 2020-04-23 VITALS — BP 123/91 | HR 82 | Temp 97.8°F | Resp 18 | Ht 62.0 in | Wt 184.7 lb

## 2020-04-23 DIAGNOSIS — Z800 Family history of malignant neoplasm of digestive organs: Secondary | ICD-10-CM | POA: Insufficient documentation

## 2020-04-23 DIAGNOSIS — C539 Malignant neoplasm of cervix uteri, unspecified: Secondary | ICD-10-CM

## 2020-04-23 DIAGNOSIS — Z923 Personal history of irradiation: Secondary | ICD-10-CM | POA: Diagnosis not present

## 2020-04-23 DIAGNOSIS — Z8541 Personal history of malignant neoplasm of cervix uteri: Secondary | ICD-10-CM

## 2020-04-23 DIAGNOSIS — D0512 Intraductal carcinoma in situ of left breast: Secondary | ICD-10-CM | POA: Insufficient documentation

## 2020-04-23 DIAGNOSIS — Z170 Estrogen receptor positive status [ER+]: Secondary | ICD-10-CM | POA: Diagnosis not present

## 2020-04-23 DIAGNOSIS — D0502 Lobular carcinoma in situ of left breast: Secondary | ICD-10-CM | POA: Insufficient documentation

## 2020-04-23 DIAGNOSIS — N644 Mastodynia: Secondary | ICD-10-CM

## 2020-04-23 DIAGNOSIS — C50412 Malignant neoplasm of upper-outer quadrant of left female breast: Secondary | ICD-10-CM

## 2020-04-23 LAB — CBC WITH DIFFERENTIAL (CANCER CENTER ONLY)
Abs Immature Granulocytes: 0.03 10*3/uL (ref 0.00–0.07)
Basophils Absolute: 0.1 10*3/uL (ref 0.0–0.1)
Basophils Relative: 1 %
Eosinophils Absolute: 0.4 10*3/uL (ref 0.0–0.5)
Eosinophils Relative: 4 %
HCT: 39.7 % (ref 36.0–46.0)
Hemoglobin: 12.4 g/dL (ref 12.0–15.0)
Immature Granulocytes: 0 %
Lymphocytes Relative: 26 %
Lymphs Abs: 2.7 10*3/uL (ref 0.7–4.0)
MCH: 25.7 pg — ABNORMAL LOW (ref 26.0–34.0)
MCHC: 31.2 g/dL (ref 30.0–36.0)
MCV: 82.4 fL (ref 80.0–100.0)
Monocytes Absolute: 0.5 10*3/uL (ref 0.1–1.0)
Monocytes Relative: 5 %
Neutro Abs: 6.9 10*3/uL (ref 1.7–7.7)
Neutrophils Relative %: 64 %
Platelet Count: 283 10*3/uL (ref 150–400)
RBC: 4.82 MIL/uL (ref 3.87–5.11)
RDW: 14.1 % (ref 11.5–15.5)
WBC Count: 10.6 10*3/uL — ABNORMAL HIGH (ref 4.0–10.5)
nRBC: 0 % (ref 0.0–0.2)

## 2020-04-23 LAB — CMP (CANCER CENTER ONLY)
ALT: 43 U/L (ref 0–44)
AST: 28 U/L (ref 15–41)
Albumin: 4.3 g/dL (ref 3.5–5.0)
Alkaline Phosphatase: 119 U/L (ref 38–126)
Anion gap: 13 (ref 5–15)
BUN: 8 mg/dL (ref 6–20)
CO2: 23 mmol/L (ref 22–32)
Calcium: 9.8 mg/dL (ref 8.9–10.3)
Chloride: 107 mmol/L (ref 98–111)
Creatinine: 0.75 mg/dL (ref 0.44–1.00)
GFR, Est AFR Am: >60 mL/min (ref >60)
GFR, Estimated: >60 mL/min (ref >60)
Glucose, Bld: 91 mg/dL (ref 70–99)
Potassium: 3.9 mmol/L (ref 3.5–5.1)
Sodium: 143 mmol/L (ref 135–145)
Total Bilirubin: 0.5 mg/dL (ref 0.3–1.2)
Total Protein: 7.8 g/dL (ref 6.5–8.1)

## 2020-04-23 NOTE — Progress Notes (Signed)
REFERRING PROVIDER: Alphonsa Overall, MD [place],  [place] [address]  PRIMARY PROVIDER:  Patient, No Pcp Per  PRIMARY REASON FOR VISIT:  1. Malignant neoplasm of upper-outer quadrant of left breast in female, estrogen receptor positive ([place])   2. Family history of colon cancer   3. Malignant neoplasm of cervix, unspecified site [facility])      HISTORY OF PRESENT ILLNESS:   Ms. Eyster, a 54 y.o. female, was seen for a [facility] cancer genetics consultation at the request of Dr. Lucia Gaskins due to a personal and family history of cancer.  Ms. Roads presents to clinic today to discuss the possibility of a hereditary predisposition to cancer, genetic testing, and to further clarify her future cancer risks, as well as potential cancer risks for family members.   In 2015, at the age of 77, Ms. Ciccarelli was diagnosed with cervical cancer. The treatment plan included a hysterectomy (completed 01/08/2014), chemotherapy, and radiation therapy.  In 2021, at the age of 89, Ms. Mcglinchey was diagnosed with ductal carcinoma in situ, ER+/PR+, of the left breast. The treatment plan includes surgery (completed 04/10/2020), adjuvant radiation therapy, and antiestrogen therapy. 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   CANCER HISTORY:  Oncology History  Malignant neoplasm of upper-outer quadrant of left breast in female, estrogen receptor positive ([place])  03/16/2020 Cancer Staging   Staging form: Breast, AJCC 8th Edition - Clinical stage from 03/16/2020: Stage 0 (cTis (DCIS), cN0, cM0, ER+, PR+) - Signed by Gardenia Phlegm, NP on 03/18/2020   03/18/2020 Initial Diagnosis   Malignant neoplasm of upper-outer quadrant of left breast in female, estrogen receptor positive ([place])      RISK FACTORS:  Menarche was at age 44.  First live birth at age 24.  OCP use for approximately 2 years.  Ovaries intact: yes.  Hysterectomy: yes.  Menopausal status: postmenopausal.  HRT use: 0  years. Colonoscopy: yes; 2016 - 1 polyp. Mammogram within the last year: yes. Number of breast biopsies: 3. Any excessive radiation exposure in the past: yes, radiation treatment for cervical cancer 2015.  Past Medical History:  Diagnosis Date  . Allergy   . Asthma   . Cervical cancer ([place]) 2015  . Family history of colon cancer     Past Surgical History:  Procedure Laterality Date  . ABDOMINAL HYSTERECTOMY    . BREAST LUMPECTOMY WITH RADIOACTIVE SEED LOCALIZATION Left 04/10/2020   Procedure: LEFT BREAST LUMPECTOMY WITH RADIOACTIVE SEED LOCALIZATION;  Surgeon: Alphonsa Overall, MD;  Location: [facility];  Service: General;  Laterality: Left;    Social History   Socioeconomic History  . Marital status: Single    Spouse name: Not on file  . Number of children: Not on file  . Years of education: Not on file  . Highest education level: 12th grade  Occupational History  . Not on file  Tobacco Use  . Smoking status: Former Smoker    Quit date: 2021    Years since quitting: 0.5  . Smokeless tobacco: Never Used  Vaping Use  . Vaping Use: Never used  Substance and Sexual Activity  . Alcohol use: Yes    Comment: occ  . Drug use: Never  . Sexual activity: Not Currently    Birth control/protection: Surgical  Other Topics Concern  . Not on file  Social History Narrative  . Not on file   Social Determinants of Health   Financial Resource Strain:   . Difficulty of Paying  Living Expenses:   Food Insecurity:   . Worried About Charity fundraiser in the Last Year:   . Arboriculturist in the Last Year:   Transportation Needs: No Transportation Needs  . Lack of Transportation (Medical): No  . Lack of Transportation (Non-Medical): No  Physical Activity:   . Days of Exercise per Week:   . Minutes of Exercise per Session:   Stress:   . Feeling of Stress :   Social Connections:   . Frequency of Communication with Friends and Family:   . Frequency of Social  Gatherings with Friends and Family:   . Attends Religious Services:   . Active Member of Clubs or Organizations:   . Attends Archivist Meetings:   Marland Kitchen Marital Status:      FAMILY HISTORY:  We obtained a detailed, 4-generation family history.  Significant diagnoses are listed below: Family History  Problem Relation Age of Onset  . Hypertension Mother   . Heart disease Mother   . High Cholesterol Mother   . Birth defects Mother   . Other Mother        adopted  . Diabetes Maternal Grandfather   . Heart Problems Father        pacemaker  . Colon cancer Paternal Uncle        dx. in his 15s   Ms. Legan has two sons (ages 38 and 73) and one daughter (age 37), as well as three grandsons and two granddaughters. She has three paternal half-sister and one paternal half-brother. None of these family members have had cancer.  Ms. Ortloff mother is 67 and has not been diagnosed with cancer, although she does not go to the doctor. Her mother was adopted, so Ms. Konicki has little information about her maternal family history. She knows her mother had siblings, although she has no inforomation on those siblings. Her maternal grandmother died younger than 23 and Ms. Edmister does not know the cause. Her maternal grandfather also died at a young age and was an alcoholic.  Ms. Dominy father is 73 and has not had cancer to her knowledge. Her father was one of 11 siblings, although Ms. Poullard only has information on one uncle, who died from colon cancer in his 46s. She does not have information about her paternal grandparents.  Ms. Schuknecht is unaware of previous family history of genetic testing for hereditary cancer risks. Patient's maternal ancestors are of [place] American descent, and paternal ancestors are of [place] American and Native American descent. There is no reported Ashkenazi Jewish ancestry. There is no known consanguinity.  GENETIC COUNSELING ASSESSMENT: Ms. Romaniello is a 54 y.o.  female with a personal and family history of cancer which is not suggestive of a hereditary cancer syndrome. We, therefore, discussed and recommended the following at today's visit.   DISCUSSION:  We discussed that, in general, most cancer is not inherited in families, but instead is sporadic or familial. Sporadic cancers occur by chance and typically happen at older ages (>50 years) as this type of cancer is caused by genetic changes acquired during an individual's lifetime. Some families have more cancers than would be expected by chance; however, the ages or types of cancer are not consistent with a known genetic mutation or known genetic mutations have been ruled out. This type of familial cancer is thought to be due to a combination of multiple genetic, environmental, hormonal, and lifestyle factors. While this combination of factors likely increases the risk  of cancer, the exact source of this risk is not currently identifiable or testable.    We discussed that approximately 5-10% of breast cancer is hereditary, meaning that it is due to a mutation in a single gene that is passed down from generation to generation in a family. Most hereditary cases of colorectal cancer are associated with the BRCA1 and BRCA2 genes, although there are other genes that can be associated with hereditary breast cancer syndromes. These include ATM, CHEK2, PALB2, etc. We discussed that identifying a hereditary cancer syndrome can be beneficial for several reasons, including knowing about other cancer risks, identifying potential screening and risk-reduction options that may be appropriate, and to understand if other family members could be at risk for cancer and allow them to undergo genetic testing.   We reviewed the characteristics, features and inheritance patterns of hereditary cancer syndromes. We discussed with Ms. Mings that the personal and family history does not meet insurance or NCCN criteria for genetic testing  and is not highly consistent with a familial hereditary cancer syndrome. We feel she is at low risk to harbor a gene mutation associated with such a condition. If Ms. Hanauer is still interested in genetic testing, there is the option to pay a self-pay price of $250. Ms. Farrier is not currently able to pay the $250, but is interested in pursuing genetic testing in the future when she is finished with school. She feels that she and her children/grandchildren would benefit from having the genetic test results. Our contact information was provided so that we can coordinate genetic testing if she chooses to move forward in the future. In the meantime, we recommended that Ms. Ryder continue to follow the cancer screening guidelines given by her primary healthcare provider.   PLAN:  Ms. Whistler does not meet medical criteria for genetic testing and did not pursue genetic testing at today's visit. We remain available to coordinate genetic testing at any time in the future. We, therefore, recommend Ms. Dehaas continue to follow the cancer screening guidelines given by her primary healthcare provider.  Lastly, we encouraged Ms. Mccabe to remain in contact with cancer genetics so that we can continuously update the family history and inform her of any changes in cancer genetics and testing that may be of benefit for this family.   Ms. Mccaffery questions were answered to her satisfaction today. Our contact information was provided should additional questions or concerns arise. Thank you for the referral and allowing [place] to share in the care of your patient.   Clint Guy, [place], [facility] Licensed, Certified Dispensing [email or web address] Phone: [phone]  The patient was seen for a total of 35 minutes in face-to-face genetic counseling.  This patient was discussed with Drs. Magrinat, Lindi Adie and/or Burr Medico who agrees with the above.     _______________________________________________________________________ For Office Staff:  Number of people involved in session: 1 Was an Intern/ student involved with case: no

## 2020-04-24 ENCOUNTER — Telehealth: Payer: Self-pay | Admitting: Oncology

## 2020-04-24 NOTE — Telephone Encounter (Signed)
Scheduled appts per 7/15 los. Called pt to go over appt. Pt stated she would call back to get the appt info.

## 2020-05-12 ENCOUNTER — Telehealth: Payer: Self-pay | Admitting: Radiation Oncology

## 2020-05-12 NOTE — Telephone Encounter (Signed)
Pt stated that she would like to cx her 8/9 consult w/ Dr. Sondra Come. I attempted to r/s her appt. However, the pt stated that she would cb to r/s and hung up.

## 2020-05-18 ENCOUNTER — Encounter: Payer: Self-pay | Admitting: *Deleted

## 2020-05-18 ENCOUNTER — Ambulatory Visit: Payer: Medicaid Other

## 2020-05-18 ENCOUNTER — Ambulatory Visit
Admission: RE | Admit: 2020-05-18 | Discharge: 2020-05-18 | Disposition: A | Discharge status: Home / Self Care | Payer: Medicaid Other | Source: Ambulatory Visit | Attending: Radiation Oncology | Admitting: Radiation Oncology

## 2020-05-29 NOTE — Progress Notes (Signed)
Name: Veronica Conrad MRN: [id number]  Date: 06/01/2020  DOB: Mar 05, 1966  KW:[id number], No Pcp Per  Alphonsa Overall, MD   REFERRING PHYSICIAN: Alphonsa Overall, MD  DIAGNOSIS: There were no encounter diagnoses.  Stage 0, Left Breast UOQ, LCIS, ER+ / PR+, Grade 2  HISTORY OF PRESENT ILLNESS::Veronica Conrad is a 54 y.o. female who is seen as a courtesy of Dr. Lucia Gaskins for an opinion concerning radiation therapy as part of management for her recently diagnosed breast cancer. She underwent bilateral diagnostic mammography and left breast ultrasonography on 01/09/2020 for two-month history of diffuse bilateral breast and axillary pain. Results showed indeterminate calcifications in the upper outer quadrant of the left breast. There was also noted to be a circumscribed oval mass in the lower portion of the left breast without sonographic correlate.  Biopsy of the upper outer left breast on 02/14/2020 showed intermediate grade ductal carcinoma in situ with calcifications and collagenous spherulosis. Prognostic indicators were significant for estrogen receptor, 90% positive and progesterone receptor, 80% positive, both with strong staining intensities.  Biopsy of the lower inner left breast on 03/16/2020 showed benign breast parenchyma with focal apocrine metaplasia but no carcinoma.  The patient's case was presented at the multidisciplinary breast conference on 03/18/2020, during which time it was recommended that she proceed with breast conserving surgery followed by adjuvant radiation therapy and antiestrogens.  The patient underwent a left breast lumpectomy on 04/10/2020 that was performed by Dr. Lucia Gaskins. Pathology from the procedure revealed lobular carcinoma in situ and fibrocystic changes with usual ductal hyperplasia and calcifications of both the left breast and left lateral margin. No ductal carcinoma in situ was identified.  The patient's case was again discussed at the multidisciplinary breast conference  on 04/23/2020, during which time she was seen by Dr. Jana Hakim. At that time, it was recommended that she proceed with adjuvant radiation therapy followed by antiestrogens at the completion of local treatment. Of note, the patient did not qualify for genetic testing.  PREVIOUS RADIATION THERAPY: Yes. Status post hysterectomy (without salpingo-oophorectomy) in 2015 in [place] for cervical carcinoma. She then underwent adjuvant radiation with weekly chemotherapy x6.  PAST MEDICAL HISTORY:  Past Medical History:  Diagnosis Date  . Allergy   . Asthma   . Cervical cancer ([place]) 2015  . Family history of colon cancer     PAST SURGICAL HISTORY: Past Surgical History:  Procedure Laterality Date  . ABDOMINAL HYSTERECTOMY    . BREAST LUMPECTOMY WITH RADIOACTIVE SEED LOCALIZATION Left 04/10/2020   Procedure: LEFT BREAST LUMPECTOMY WITH RADIOACTIVE SEED LOCALIZATION;  Surgeon: Alphonsa Overall, MD;  Location: [facility];  Service: General;  Laterality: Left;    FAMILY HISTORY:  Family History  Problem Relation Age of Onset  . Hypertension Mother   . Heart disease Mother   . High Cholesterol Mother   . Birth defects Mother   . Other Mother        adopted  . Diabetes Maternal Grandfather   . Heart Problems Father        pacemaker  . Colon cancer Paternal Uncle        dx. in his 6s    SOCIAL HISTORY:  Social History   Tobacco Use  . Smoking status: Former Smoker    Quit date: 2021    Years since quitting: 0.5  . Smokeless tobacco: Never Used  Vaping Use  . Vaping Use: Never used  Substance Use Topics  . Alcohol use: Yes    Comment: occ  .  Drug use: Never    ALLERGIES: No Known Allergies      IMPRESSION: Stage 0, Left Breast UOQ, LCIS, ER+ / PR+, Grade 2   Today, I talked to the patient and family about the findings and work-up thus far.  We discussed the natural history of breast cancer and general treatment, highlighting the role of radiotherapy in the management.   We discussed the available radiation techniques, and focused on the details of logistics and delivery.  We reviewed the anticipated acute and late sequelae associated with radiation in this setting.  The patient was encouraged to ask questions that I answered to the best of my ability. A patient consent form was discussed and signed.  We retained a copy for our records.  The patient would like to proceed with radiation and will be scheduled for CT simulation.  Location of Breast Cancer: Left Breast    Past/Anticipated interventions by medical oncology, if any: Chemotherapy  Dr. Jana Hakim  Lymphedema issues, if any:  No  Pain Issues: no  SAFETY ISSUES:  Prior radiation? Yes, cervical 6 weeks of treatment.  Pacemaker/ICD? No  Possible current pregnancy? hysterectomy  Is the patient on methotrexate? No  Current Complaints / other details:   BP (!) 131/98 (BP Location: Right Arm, Patient Position: Sitting, Cuff Size: Normal)   Pulse 84   Temp 98.2 F (36.8 C)   Resp 20   Ht 5\' 2"  (1.575 m)   Wt 185 lb 6.4 oz (84.1 kg)   SpO2 100%   BMI 33.91 kg/m    Wt Readings from Last 3 Encounters:  06/03/20 185 lb 6.4 oz (84.1 kg)  04/23/20 184 lb 11.2 oz (83.8 kg)  04/10/20 182 lb 15.7 oz (83 kg)

## 2020-06-01 ENCOUNTER — Telehealth: Payer: Self-pay | Admitting: *Deleted

## 2020-06-01 NOTE — Progress Notes (Signed)
Radiation Oncology         [phone] ________________________________  Initial Outpatient Consultation  Name: Veronica Conrad MRN: [id number]  Date: 06/03/2020  DOB: 1966-02-16  WH:QPRFFMB, No Pcp Per  Alphonsa Overall, MD   REFERRING PHYSICIAN: Alphonsa Overall, MD  DIAGNOSIS: The encounter diagnosis was Malignant neoplasm of upper-outer quadrant of left breast in female, estrogen receptor positive ([employer]).  Stage 0, Left Breast UOQ, LCIS, ER+ / PR+, Grade 2  HISTORY OF PRESENT ILLNESS::Veronica Conrad is a 54 y.o. female who is seen as a courtesy of Dr. Lucia Gaskins for an opinion concerning radiation therapy as part of management for her recently diagnosed breast cancer. She underwent bilateral diagnostic mammography and left breast ultrasonography on 01/09/2020 for two-month history of diffuse bilateral breast and axillary pain. Results showed indeterminate calcifications in the upper outer quadrant of the left breast. There was also noted to be a circumscribed oval mass in the lower portion of the left breast without sonographic correlate.  Biopsy of the upper outer left breast on 02/14/2020 showed intermediate grade ductal carcinoma in situ with calcifications and collagenous spherulosis. Prognostic indicators were significant for estrogen receptor, 90% positive and progesterone receptor, 80% positive, both with strong staining intensities.  Biopsy of the lower inner left breast on 03/16/2020 showed benign breast parenchyma with focal apocrine metaplasia but no carcinoma.  The patient's case was presented at the multidisciplinary breast conference on 03/18/2020, during which time it was recommended that she proceed with breast conserving surgery followed by adjuvant radiation therapy and antiestrogens.  The patient underwent a left breast lumpectomy on 04/10/2020 that was performed by Dr. Lucia Gaskins. Pathology from the procedure revealed lobular carcinoma in situ and fibrocystic changes with usual  ductal hyperplasia and calcifications of both the left breast and left lateral margin. No ductal carcinoma in situ or invasive carcinoma was identified.  The patient's case was again discussed at the multidisciplinary breast conference on 04/23/2020, during which time she was seen by Dr. Jana Hakim. At that time, it was recommended that she proceed with adjuvant radiation therapy followed by antiestrogens at the completion of local treatment. Of note, the patient did not qualify for genetic testing.  PREVIOUS RADIATION THERAPY: Yes. Status post hysterectomy (without salpingo-oophorectomy) in 2015 in [place] for cervical carcinoma. She then underwent adjuvant radiation with weekly chemotherapy x6.  PAST MEDICAL HISTORY:  Past Medical History:  Diagnosis Date   Allergy    Asthma    Cervical cancer ([place]) 2015   Family history of colon cancer     PAST SURGICAL HISTORY: Past Surgical History:  Procedure Laterality Date   ABDOMINAL HYSTERECTOMY     BREAST LUMPECTOMY WITH RADIOACTIVE SEED LOCALIZATION Left 04/10/2020   Procedure: LEFT BREAST LUMPECTOMY WITH RADIOACTIVE SEED LOCALIZATION;  Surgeon: Alphonsa Overall, MD;  Location: [facility];  Service: General;  Laterality: Left;    FAMILY HISTORY:  Family History  Problem Relation Age of Onset   Hypertension Mother    Heart disease Mother    High Cholesterol Mother    Birth defects Mother    Other Mother        adopted   Diabetes Maternal Grandfather    Heart Problems Father        pacemaker   Colon cancer Paternal Uncle        dx. in his 79s    SOCIAL HISTORY:  Social History   Tobacco Use   Smoking status: Former Smoker    Quit date: 2021    Years  since quitting: 0.6   Smokeless tobacco: Never Used  Vaping Use   Vaping Use: Never used  Substance Use Topics   Alcohol use: Yes    Comment: occ   Drug use: Never    ALLERGIES: No Known Allergies  MEDICATIONS:  Current Outpatient Medications    Medication Sig Dispense Refill   loratadine (CLARITIN) 10 MG tablet Take 10 mg by mouth daily.     No current facility-administered medications for this encounter.    REVIEW OF SYSTEMS:  A 10+ POINT REVIEW OF SYSTEMS WAS OBTAINED including neurology, dermatology, psychiatry, cardiac, respiratory, lymph, extremities, GI, GU, musculoskeletal, constitutional, reproductive, HEENT.  Denies any pain within the left breast at this time.  She denies any significant swelling in the breast to her left arm.  She reports lactose intolerance and chronic problems with diarrhea related to her pelvic radiation therapy for cervical cancer   PHYSICAL EXAM:  height is 5\' 2"  (1.575 m) and weight is 185 lb 6.4 oz (84.1 kg). Her temperature is 98.2 F (36.8 C). Her blood pressure is 131/98 (abnormal) and her pulse is 84. Her respiration is 20 and oxygen saturation is 100%.   General: Alert and oriented, in no acute distress HEENT: Head is normocephalic. Extraocular movements are intact.  Neck: Neck is supple, no palpable cervical or supraclavicular lymphadenopathy. Heart: Regular in rate and rhythm with no murmurs, rubs, or gallops. Chest: Clear to auscultation bilaterally, with no rhonchi, wheezes, or rales. Abdomen: Soft, nontender, nondistended, with no rigidity or guarding. Extremities: No cyanosis or edema. Lymphatics: see Neck Exam Skin: No concerning lesions. Musculoskeletal: symmetric strength and muscle tone throughout. Neurologic: Cranial nerves II through XII are grossly intact. No obvious focalities. Speech is fluent. Coordination is intact. Psychiatric: Judgment and insight are intact. Affect is appropriate. Right breast: No palpable mass, nipple discharge, or bleeding.  Left breast: Well-healed scar in the upper outer quadrant.  No dominant mass appreciated in the breast nipple discharge or bleeding  ECOG = 1  0 - Asymptomatic (Fully active, able to carry on all predisease activities without  restriction)  1 - Symptomatic but completely ambulatory (Restricted in physically strenuous activity but ambulatory and able to carry out work of a light or sedentary nature. For example, light housework, office work)  2 - Symptomatic, <50% in bed during the day (Ambulatory and capable of all self care but unable to carry out any work activities. Up and about more than 50% of waking hours)  3 - Symptomatic, >50% in bed, but not bedbound (Capable of only limited self-care, confined to bed or chair 50% or more of waking hours)  4 - Bedbound (Completely disabled. Cannot carry on any self-care. Totally confined to bed or chair)  5 - Death   Eustace Pen MM, Creech RH, Tormey DC, et al. [phone]). "Toxicity and response criteria of the [facility]". [place] Oncol. 5 (6): 649-55  LABORATORY DATA:  Lab Results  Component Value Date   WBC 10.6 (H) 04/23/2020   HGB 12.4 04/23/2020   HCT 39.7 04/23/2020   MCV 82.4 04/23/2020   PLT 283 04/23/2020   NEUTROABS 6.9 04/23/2020   Lab Results  Component Value Date   NA 143 04/23/2020   K 3.9 04/23/2020   CL 107 04/23/2020   CO2 23 04/23/2020   GLUCOSE 91 04/23/2020   CREATININE 0.75 04/23/2020   CALCIUM 9.8 04/23/2020      RADIOGRAPHY: No results found.    IMPRESSION: Stage 0, Left  Breast UOQ, LCIS, ER+ / PR+, Grade 2  Patient was found to have ductal carcinoma in situ, intermediate grade on her initial biopsy however lumpectomy specimen showed no residual DCIS or invasive tumor.  In light of the patient's young age I would recommend adjuvant radiation therapy as part of her overall management to reduce the chances for local recurrence.  Patient would appear to be a candidate for hypofractionated accelerated radiation therapy over approximately 3-1/2 to 4 weeks.  Today, I talked to the patient  about the findings and work-up thus far.  We discussed the natural history of breast cancer and general treatment, highlighting the  role of radiotherapy in the management.  We discussed the available radiation techniques, and focused on the details of logistics and delivery.  We reviewed the anticipated acute and late sequelae associated with radiation in this setting.  The patient was encouraged to ask questions that I answered to the best of my ability.   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  She reports that she would not be able to break away from work 8-4 30 Monday through Friday.  She reports that she would lose her job if she was not present during those hours Monday through Friday.  She reports working remotely at this time in light of the COVID-19 pandemic.  I discussed with the patient that we could see conceivably treat her after 430 Monday through Friday.  She reports that she does not however have any personal transportation,  the transportation available through her health insurance is unreliable she reports.  Patient expresses interest in radiation therapy but does not want to proceed with this recommended therapy at the expense of losing her job.  PLAN: Social worker has been contacted to see if there is any reliable transportation options at the 430-hour Monday through Friday. If the Patient does not proceed with radiotherapy then she will proceed with adjuvant hormonal therapy as recommended by Dr. Jana Hakim.  Total time spent in this encounter was 60 minutes which included reviewing the patient's most recent mammogram, biopsies, lumpectomy, biopsies, consultations, physical examination, and documentation.   ------------------------------------------------  Blair Promise, PhD, MD  This document serves as a record of services personally performed by Gery Pray, MD. It was created on his behalf by Clerance Lav, a trained medical scribe. The creation of this record is based on the scribe's personal observations and the provider's statements to them. This document has been checked and approved by  the attending provider.

## 2020-06-01 NOTE — Telephone Encounter (Signed)
This RN spoke with pt per her call wanting to verify when her next appointment with Dr Jana Hakim is.  She stated she is also scheduled to see Dr Sondra Come " but my problem with radiation is - I don't have reliable transportation "  This RN informed her this office has a program for transportation that can assist her- especially with her radiation appointments which can be more an issue due to being a daily need.  This RN asked for the patient to inform the nurse at her radiation appointment of the above.  This note will also be forwarded for communication purposes to [employer] nurse and navigators.  This RN also informed pt of her next scheduled appointment with Dr Jana Hakim.

## 2020-06-03 ENCOUNTER — Ambulatory Visit
Admission: RE | Admit: 2020-06-03 | Discharge: 2020-06-03 | Disposition: A | Discharge status: Home / Self Care | Payer: Medicaid Other | Source: Ambulatory Visit | Attending: Radiation Oncology | Admitting: Radiation Oncology

## 2020-06-03 ENCOUNTER — Other Ambulatory Visit: Payer: Self-pay

## 2020-06-03 ENCOUNTER — Telehealth: Payer: Self-pay | Admitting: *Deleted

## 2020-06-03 VITALS — BP 131/98 | HR 84 | Temp 98.2°F | Resp 20 | Ht 62.0 in | Wt 185.4 lb

## 2020-06-03 DIAGNOSIS — Z87891 Personal history of nicotine dependence: Secondary | ICD-10-CM | POA: Diagnosis not present

## 2020-06-03 DIAGNOSIS — Z170 Estrogen receptor positive status [ER+]: Secondary | ICD-10-CM | POA: Insufficient documentation

## 2020-06-03 DIAGNOSIS — Z9221 Personal history of antineoplastic chemotherapy: Secondary | ICD-10-CM | POA: Diagnosis not present

## 2020-06-03 DIAGNOSIS — C50412 Malignant neoplasm of upper-outer quadrant of left female breast: Secondary | ICD-10-CM | POA: Diagnosis present

## 2020-06-03 DIAGNOSIS — Z90710 Acquired absence of both cervix and uterus: Secondary | ICD-10-CM | POA: Diagnosis not present

## 2020-06-03 DIAGNOSIS — Z923 Personal history of irradiation: Secondary | ICD-10-CM | POA: Diagnosis not present

## 2020-06-03 DIAGNOSIS — Z8541 Personal history of malignant neoplasm of cervix uteri: Secondary | ICD-10-CM | POA: Diagnosis not present

## 2020-06-03 DIAGNOSIS — Z800 Family history of malignant neoplasm of digestive organs: Secondary | ICD-10-CM | POA: Diagnosis not present

## 2020-06-03 NOTE — Telephone Encounter (Signed)
This RN spoke with pt per call - stating she was seen by radiation " and I will not be doing it- due to my work schedule and transportation issues "  She states she works from [place] to 430pm and " my car broke down and you cannot rely on medicaid for transportation."  " so the doctor in radiation said to let you know so Dr Jana Hakim can start the anti hormone pill "  This RN discussed  best outcome with recommended therapy and concern for issues that are interfering.  Veronica Conrad stated " I had radiation up in [place] but I could do it because their hours were from 6 am to 6 pm but I need to work - I cannot become homeless because of this and I already had a stage 4 cancer "  This RN validated her concerns and informed her above would be reviewed with MD and she would be called about the anti estrogen and further appointments.

## 2020-06-04 ENCOUNTER — Encounter: Payer: Self-pay | Admitting: Licensed Clinical Social Worker

## 2020-06-04 ENCOUNTER — Other Ambulatory Visit: Payer: Self-pay | Admitting: Oncology

## 2020-06-04 DIAGNOSIS — D0502 Lobular carcinoma in situ of left breast: Secondary | ICD-10-CM

## 2020-06-04 DIAGNOSIS — Z170 Estrogen receptor positive status [ER+]: Secondary | ICD-10-CM

## 2020-06-04 MED ORDER — ANASTROZOLE 1 MG PO TABS
1.0000 mg | ORAL_TABLET | Freq: Every day | ORAL | 4 refills | Status: DC
Start: 2020-06-04 — End: 2020-12-09

## 2020-06-04 NOTE — Progress Notes (Signed)
[facility] Psychosocial Distress Screening Clinical Social Work  Clinical Social Work was referred by distress screening protocol.  The patient scored a 6 on the Psychosocial Distress Thermometer which indicates moderate distress. Clinical Social Worker contacted patient by phone to assess for distress and other psychosocial needs.   Patient reports that she does not want to do radiation and does not need "the extra stress" right now. She has had difficulty with [place] transportation. CSW reviewed [facility] transportation option and patient stated "Oh yes, that has been working well" and that she is able to get to appointments at the cancer center without issue.  She also noted that she is working on her mental health. CSW offered support or to assist with counseling options. Patient stated she has an appt at [facility] on 06/25/2020 so does not need assistance at this time.  ONCBCN DISTRESS SCREENING 06/03/2020  Screening Type Initial Screening  Distress experienced in past week (1-10) 6  Practical problem type Work/school;Transportation  Emotional problem type Nervousness/Anxiety;Adjusting to illness  Physical Problem type Sleep/insomnia  Other Contact via [phone]    Clinical Social Worker follow up needed: No.  If yes, follow up plan:  Cervando Durnin, [place], LCSW

## 2020-06-05 ENCOUNTER — Encounter: Payer: Self-pay | Admitting: *Deleted

## 2020-06-05 ENCOUNTER — Telehealth: Payer: Self-pay

## 2020-06-05 NOTE — Telephone Encounter (Signed)
LVM for patient to call if she needed assistance with arranging transportation to see Dr. Sondra Come at her next appointment.

## 2020-06-29 ENCOUNTER — Telehealth: Payer: Self-pay | Admitting: Oncology

## 2020-06-29 NOTE — Telephone Encounter (Signed)
R/s 9/21 per patients request. Called and spoke with patient. Wanted to push out to November per new job schedule. Confirmed appt

## 2020-06-30 ENCOUNTER — Ambulatory Visit: Payer: No Typology Code available for payment source | Admitting: Oncology

## 2020-06-30 ENCOUNTER — Other Ambulatory Visit: Payer: No Typology Code available for payment source

## 2020-09-01 ENCOUNTER — Other Ambulatory Visit: Payer: Medicaid Other

## 2020-09-01 ENCOUNTER — Ambulatory Visit: Payer: Self-pay | Admitting: Oncology

## 2020-11-09 ENCOUNTER — Other Ambulatory Visit: Payer: Self-pay | Admitting: *Deleted

## 2020-11-09 DIAGNOSIS — R5381 Other malaise: Secondary | ICD-10-CM

## 2020-11-10 ENCOUNTER — Telehealth: Payer: Self-pay | Admitting: Oncology

## 2020-11-10 NOTE — Telephone Encounter (Signed)
Scheduled appt per 1/31 sch msg - pt is aware of appt date and time   

## 2020-11-11 NOTE — Progress Notes (Signed)
Veronica Conrad  [phone] [phone]    ID: Veronica Conrad DOB: 24-Sep-1966  MR#: [id number]  GQB#:[id number]  Patient Care Team: Patient, No Pcp Per as PCP - General (General Practice) Mauro Kaufmann, RN as Oncology Nurse Navigator Rockwell Germany, RN as Oncology Nurse Navigator Magrinat, Virgie Dad, MD as Consulting Physician (Oncology) Gery Pray, MD as Consulting Physician (Radiation Oncology) Alphonsa Overall, MD as Consulting Physician (General Surgery) OTHER MD:   CHIEF COMPLAINT: Ductal carcinoma in situ  CURRENT TREATMENT: Awaiting adjuvant radiation   HISTORY OF CURRENT ILLNESS: Veronica Conrad noted diffuse bilateral breast and axillary pain radiating into the axillary regions for 2 months. She underwent bilateral diagnostic mammography with tomography and left breast ultrasonography at [facility] on 01/09/2020 showing: Breast Density Category B. Spot compression views are performed of a possible mass in the lower central portion of the breast. These views confirm presence of a lobulated 8 millimeter mass in the lower central aspect of the left breast, best seen on oblique projection. A tissue marker clip is identified within the lower inner quadrant of the left breast. Magnified views are performed of calcifications in the upper-outer quadrant of the left breast. On magnified views there is a 5 millimeter group of calcifications with linear distribution, and indeterminate. Scattered punctate calcifications are also identified throughout the upper-outer quadrant of the left breast. Targeted ultrasound is performed, showing normal appearing fibroglandular tissue throughout the lower portions of the left breast. No suspicious mass, distortion, or acoustic shadowing is demonstrated with ultrasound.  Accordingly on 02/14/2020 she proceeded to biopsy of the left breast area in question. The pathology from this procedure showed [id number]): ductal  carcinoma in situ with calcifications, upper-outer left breast. Prognostic indicators significant for: estrogen receptor, 90% positive and progesterone receptor, 80% positive, both with strong staining intensity.   Additional pathology from a biopsy on 03/16/2020 showed ([id number]): benign breast parenchyma with focal apocrine metaplasia.  She underwent a left lumpectomy on 04/10/2020 under Dr. Alphonsa Overall. The pathology from this procedure showed ([id number]): A. BREAST, LEFT, LUMPECTOMY:  - Lobular carcinoma in situ (LCIS).  - Fibrocystic changes with usual ductal hyperplasia and calcifications.  - No ductal carcinoma in situ.  - See comment.  B. BREAST, LEFT LATERAL MARGIN, EXCISION:  - Lobular carcinoma in situ (LCIS).  - Fibrocystic changes with usual ductal hyperplasia and calcifications.  - No ductal carcinoma in situ identified.  - See comment.  COMMENT: In the left breast lumpectomy specimen (part A) there are foci of lobular neoplasia (lobular carcinoma in situ) and E-cadherin is negative supporting the diagnosis. There are also fibrocystic changes with usual ductal hyperplasia and calcifications. In the lumpectomy specimen (part A), the lobular carcinoma in situ is focally less than 0.1 cm from the lateral and posterior margins and in the left lateral margin specimen (part B) less than 0.1 cm from the final lateral margin. No ductal carcinoma in situ is identified and immunohistochemistry for cytokeratin 5/6 supports the diagnosis.   The patient's subsequent history is as detailed below.   INTERVAL HISTORY: Veronica Conrad is here today for evaluation of her estrogen positive non invasive breast cancer.  She underwent lumpectomy 04/10/2020 and did well with this.  She met with radiation oncology afterward and opted to not receive adjuvant radiation therapy.  She informed me that she was unaware that anastrozole was prescribed for her to take in 05/2020.  She asked my nurse Veronica Conrad to call  and look into this for her.  REVIEW OF SYSTEMS: [name] recently lost her job and has been struggling with this mentally.  She notes that she has an upcoming interview tomorrow that she is hopeful about.  She notes that she has increased pain in her right breast.  She has not undergone mammogram since prior to her surgery.  She denies any breast mass or swelling.  She notes that she is not physically active because she is discouraged about her job situation.  A detailed ROS was otherwise non contributory today.     PAST MEDICAL HISTORY: Past Medical History:  Diagnosis Date  . Allergy   . Asthma   . Cervical cancer ([place]) 2015  . Family history of colon cancer      PAST SURGICAL HISTORY: Past Surgical History:  Procedure Laterality Date  . ABDOMINAL HYSTERECTOMY    . BREAST LUMPECTOMY WITH RADIOACTIVE SEED LOCALIZATION Left 04/10/2020   Procedure: LEFT BREAST LUMPECTOMY WITH RADIOACTIVE SEED LOCALIZATION;  Surgeon: Alphonsa Overall, MD;  Location: [facility];  Service: General;  Laterality: Left;  The patient has a history of cervical carcinoma diagnosed in 2015 treated with hysterectomy, at radiation with weekly chemotherapy for 6 weeks.   FAMILY HISTORY: Family History  Problem Relation Age of Onset  . Hypertension Mother   . Heart disease Mother   . High Cholesterol Mother   . Birth defects Mother   . Other Mother        adopted  . Diabetes Maternal Grandfather   . Heart Problems Father        pacemaker  . Colon cancer Paternal Uncle        dx. in his 74s   Veronica Conrad has very little information on her family.  Her mother was adopted.  A parental grandparents had colon cancer.  The patient has 4/2 siblings, all on the father's side, 3 half-sisters and one half brother.  None of them have cancer.  She is not aware of any ovarian breast or pancreatic cancer in the family   GYNECOLOGIC HISTORY:  No LMP recorded. Patient has had a hysterectomy. Menarche: 55 years  old Age at first live birth: 55 years old GX P: 3 LMP: Status post hysterectomy age 55 Contraceptive: Status post tubal ligation; also received oral contraceptives without complications remotely HRT: no Hysterectomy?:  yes BSO?:  No   SOCIAL HISTORY: (Current as of 11/12/2020) Veronica Conrad works as a Journalist, newspaper.  She works from home, mostly on the phone.  She moved to [place] from the [place] in 2020.  She lives by herself.  She is divorced.  Her son Barbaraann Rondo lives in the [place] and works in Engineer, technical sales; son Darius lives in [place] and is disabled Nature conservation officer; daughter Charlynn Grimes works in the [place] in [place].  The patient has 5 grandchildren.  She attends a [employer].   ADVANCED DIRECTIVES:     HEALTH MAINTENANCE: Social History   Tobacco Use  . Smoking status: Former Smoker    Quit date: 2021    Years since quitting: 1.0  . Smokeless tobacco: Never Used  Vaping Use  . Vaping Use: Never used  Substance Use Topics  . Alcohol use: Yes    Comment: occ  . Drug use: Never    Colonoscopy: 2016  PAP: Up-to-date  Bone density:    No Known Allergies  Current Outpatient Medications  Medication Sig Dispense Refill  . anastrozole (ARIMIDEX) 1 MG tablet Take 1 tablet (1 mg total) by  mouth daily. 90 tablet 4  . loratadine (CLARITIN) 10 MG tablet Take 10 mg by mouth daily.     No current facility-administered medications for this visit.     OBJECTIVE: African-American woman who appears stated age  81:   11/12/20 1041  BP: (!) 124/93  Pulse: (!) 106  Resp: 17  Temp: (!) 97.2 F (36.2 C)  SpO2: 100%   Wt Readings from Last 3 Encounters:  11/12/20 174 lb (78.9 kg)  06/03/20 185 lb 6.4 oz (84.1 kg)  04/23/20 184 lb 11.2 oz (83.8 kg)   Body mass index is 31.83 kg/m.    ECOG FS:1 - Symptomatic but completely ambulatory GENERAL: Patient is a well appearing female in no acute distress HEENT:  Sclerae anicteric.  Mask in Place.  Neck is supple.  NODES:  No cervical, supraclavicular, or axillary lymphadenopathy palpated.  BREAST EXAM:  Left breast s/p lumpectomy and radiation, no sign of local recurrence LUNGS:  Clear to auscultation bilaterally.  No wheezes or rhonchi. HEART:  Regular rate and rhythm. No murmur appreciated. ABDOMEN:  Soft, nontender.  Positive, normoactive bowel sounds. No organomegaly palpated. MSK:  No focal spinal tenderness to palpation. Full range of motion bilaterally in the upper extremities. EXTREMITIES:  No peripheral edema.   SKIN:  Clear with no obvious rashes or skin changes. No nail dyscrasia. NEURO:  Nonfocal. Well oriented.  Appropriate affect.     LAB RESULTS:  CMP     Component Value Date/Time   NA 143 04/23/2020 1558   K 3.9 04/23/2020 1558   CL 107 04/23/2020 1558   CO2 23 04/23/2020 1558   GLUCOSE 91 04/23/2020 1558   BUN 8 04/23/2020 1558   CREATININE 0.75 04/23/2020 1558   CALCIUM 9.8 04/23/2020 1558   PROT 7.8 04/23/2020 1558   ALBUMIN 4.3 04/23/2020 1558   AST 28 04/23/2020 1558   ALT 43 04/23/2020 1558   ALKPHOS 119 04/23/2020 1558   BILITOT 0.5 04/23/2020 1558   GFRNONAA >60 04/23/2020 1558   GFRAA >60 04/23/2020 1558    No results found for: TOTALPROTELP, ALBUMINELP, A1GS, A2GS, BETS, BETA2SER, GAMS, MSPIKE, SPEI  No results found for: KPAFRELGTCHN, LAMBDASER, KAPLAMBRATIO  Lab Results  Component Value Date   WBC 10.6 (H) 04/23/2020   NEUTROABS 6.9 04/23/2020   HGB 12.4 04/23/2020   HCT 39.7 04/23/2020   MCV 82.4 04/23/2020   PLT 283 04/23/2020    No results found for: LABCA2  No components found for: JQZESP233  No results for input(s): INR in the last 168 hours.  No results found for: LABCA2  No results found for: AQT622  No results found for: QJF354  No results found for: TGY563  No results found for: CA2729  No components found for: HGQUANT  No results found for: CEA1 / No results found for: CEA1   No results found for:  AFPTUMOR  No results found for: CHROMOGRNA  No results found for: PSA1  No visits with results within 3 Day(s) from this visit.  Latest known visit with results is:  Appointment on 04/23/2020  Component Date Value Ref Range Status  . Sodium 04/23/2020 143  135 - 145 mmol/L Final  . Potassium 04/23/2020 3.9  3.5 - 5.1 mmol/L Final  . Chloride 04/23/2020 107  98 - 111 mmol/L Final  . CO2 04/23/2020 23  22 - 32 mmol/L Final  . Glucose, Bld 04/23/2020 91  70 - 99 mg/dL Final   Glucose reference range applies only to samples  taken after fasting for at least 8 hours.  . BUN 04/23/2020 8  6 - 20 mg/dL Final  . Creatinine 04/23/2020 0.75  0.44 - 1.00 mg/dL Final  . Calcium 04/23/2020 9.8  8.9 - 10.3 mg/dL Final  . Total Protein 04/23/2020 7.8  6.5 - 8.1 g/dL Final  . Albumin 04/23/2020 4.3  3.5 - 5.0 g/dL Final  . AST 04/23/2020 28  15 - 41 U/L Final  . ALT 04/23/2020 43  0 - 44 U/L Final  . Alkaline Phosphatase 04/23/2020 119  38 - 126 U/L Final  . Total Bilirubin 04/23/2020 0.5  0.3 - 1.2 mg/dL Final  . GFR, Estimated 04/23/2020 >60  >60 mL/min Final  . GFR, Est AFR Am 04/23/2020 >60  >60 mL/min Final  . Anion gap 04/23/2020 13  5 - 15 Final   Performed at [facility], [place] [address], [place], [place] [address]  . WBC Count 04/23/2020 10.6* 4.0 - 10.5 K/uL Final  . RBC 04/23/2020 4.82  3.87 - 5.11 MIL/uL Final  . Hemoglobin 04/23/2020 12.4  12.0 - 15.0 g/dL Final  . HCT 04/23/2020 39.7  36.0 - 46.0 % Final  . MCV 04/23/2020 82.4  80.0 - 100.0 fL Final  . MCH 04/23/2020 25.7* 26.0 - 34.0 pg Final  . MCHC 04/23/2020 31.2  30.0 - 36.0 g/dL Final  . RDW 04/23/2020 14.1  11.5 - 15.5 % Final  . Platelet Count 04/23/2020 283  150 - 400 K/uL Final  . nRBC 04/23/2020 0.0  0.0 - 0.2 % Final  . Neutrophils Relative % 04/23/2020 64  % Final  . Neutro Abs 04/23/2020 6.9  1.7 - 7.7 K/uL Final  . Lymphocytes Relative 04/23/2020 26  % Final  . Lymphs Abs 04/23/2020 2.7   0.7 - 4.0 K/uL Final  . Monocytes Relative 04/23/2020 5  % Final  . Monocytes Absolute 04/23/2020 0.5  0.1 - 1.0 K/uL Final  . Eosinophils Relative 04/23/2020 4  % Final  . Eosinophils Absolute 04/23/2020 0.4  0.0 - 0.5 K/uL Final  . Basophils Relative 04/23/2020 1  % Final  . Basophils Absolute 04/23/2020 0.1  0.0 - 0.1 K/uL Final  . Immature Granulocytes 04/23/2020 0  % Final  . Abs Immature Granulocytes 04/23/2020 0.03  0.00 - 0.07 K/uL Final   Performed at [facility], [place] [address], [place], [place] [address]    (this displays the last labs from the last 3 days)  No results found for: TOTALPROTELP, ALBUMINELP, A1GS, A2GS, BETS, BETA2SER, GAMS, MSPIKE, SPEI (this displays SPEP labs)  No results found for: KPAFRELGTCHN, LAMBDASER, KAPLAMBRATIO (kappa/lambda light chains)  No results found for: HGBA, HGBA2QUANT, HGBFQUANT, HGBSQUAN (Hemoglobinopathy evaluation)   No results found for: LDH  No results found for: IRON, TIBC, IRONPCTSAT (Iron and TIBC)  No results found for: FERRITIN  Urinalysis No results found for: COLORURINE, APPEARANCEUR, LABSPEC, PHURINE, GLUCOSEU, HGBUR, BILIRUBINUR, KETONESUR, PROTEINUR, UROBILINOGEN, NITRITE, LEUKOCYTESUR   STUDIES:  No results found.   ELIGIBLE FOR AVAILABLE RESEARCH PROTOCOL: AET   ASSESSMENT: 55 y.o. [place] woman   (1) status post hysterectomy (without salpingo-oophorectomy) 2015 in [place] for cervical carcinoma, status post adjuvant radiation with weekly chemotherapy x6  (2) status post left breast biopsy 02/14/2020 for ductal carcinoma in situ, grade 2, estrogen and progesterone receptor strongly positive  (a) biopsy of a second suspicious area, lower inner quadrant, of the left breast 03/16/2020 was benign  (3) status post left breast lumpectomy 04/10/2020  showing no residual ductal carcinoma in situ, lobular carcinoma in situ.  (4) genetics counseling 04/23/2020: Patient did not qualify for  testing  (5) adjuvant radiation declined by patient  (4) antiestrogens at the completion of local treatment.   PLAN: Demaris Callander is here today for f/u for her breast pain.  I dont' find any evidence for recurrence on exam, however I recommended she undergo bilateral breast diagnostic mammogram and left breast ultraosund to fully evaluate.  We discussed that breast pain is common following surgery, and typically it improves with time.  She has no swelling that makes me concerned for breast lymphedema, and no signs of infection either.   We discussed adjuvant therapy with antiestrogen pills.  Anastrozole had been sent into her pharmacy and she plans on starting these.  She is 58 and says she still has her ovaries.  She will undergo lab testing today to check her menopausal status.    I recommended she see Dr. Jana Hakim in 4 weeks virtually, and I will see her in 4 months virtually for an SCP visit.  She knows to call for any questions that may arise between now and her next appointment.  We are happy to see her sooner if needed.  Total encounter time: 20 minutes*   Wilber Bihari, NP 11/12/20 11:01 AM Medical Oncology and Hematology [facility] [place], [place] [address] Tel. [phone]    Fax. [phone]     *Total Encounter Time as defined by the Centers for Medicare and Medicaid Services includes, in addition to the face-to-face time of a patient visit (documented in the note above) non-face-to-face time: obtaining and reviewing outside history, ordering and reviewing medications, tests or procedures, care coordination (communications with other health care professionals or caregivers) and documentation in the medical record.

## 2020-11-12 ENCOUNTER — Other Ambulatory Visit: Payer: Self-pay

## 2020-11-12 ENCOUNTER — Telehealth: Payer: Self-pay | Admitting: *Deleted

## 2020-11-12 ENCOUNTER — Inpatient Hospital Stay: Payer: Medicaid Other

## 2020-11-12 ENCOUNTER — Telehealth: Payer: Self-pay | Admitting: Adult Health

## 2020-11-12 ENCOUNTER — Encounter: Payer: Self-pay | Admitting: Adult Health

## 2020-11-12 ENCOUNTER — Inpatient Hospital Stay: Payer: Medicaid Other | Attending: Adult Health | Admitting: Adult Health

## 2020-11-12 VITALS — BP 124/93 | HR 106 | Temp 97.2°F | Resp 17 | Ht 62.0 in | Wt 174.0 lb

## 2020-11-12 DIAGNOSIS — C50412 Malignant neoplasm of upper-outer quadrant of left female breast: Secondary | ICD-10-CM | POA: Diagnosis not present

## 2020-11-12 DIAGNOSIS — Z87891 Personal history of nicotine dependence: Secondary | ICD-10-CM | POA: Insufficient documentation

## 2020-11-12 DIAGNOSIS — Z8541 Personal history of malignant neoplasm of cervix uteri: Secondary | ICD-10-CM | POA: Diagnosis not present

## 2020-11-12 DIAGNOSIS — Z9221 Personal history of antineoplastic chemotherapy: Secondary | ICD-10-CM | POA: Diagnosis not present

## 2020-11-12 DIAGNOSIS — Z170 Estrogen receptor positive status [ER+]: Secondary | ICD-10-CM

## 2020-11-12 DIAGNOSIS — N644 Mastodynia: Secondary | ICD-10-CM | POA: Insufficient documentation

## 2020-11-12 DIAGNOSIS — D0502 Lobular carcinoma in situ of left breast: Secondary | ICD-10-CM

## 2020-11-12 DIAGNOSIS — D0512 Intraductal carcinoma in situ of left breast: Secondary | ICD-10-CM | POA: Diagnosis present

## 2020-11-12 DIAGNOSIS — Z90710 Acquired absence of both cervix and uterus: Secondary | ICD-10-CM | POA: Diagnosis not present

## 2020-11-12 DIAGNOSIS — Z923 Personal history of irradiation: Secondary | ICD-10-CM | POA: Insufficient documentation

## 2020-11-12 LAB — CBC WITH DIFFERENTIAL/PLATELET
Abs Immature Granulocytes: 0.04 10*3/uL (ref 0.00–0.07)
Basophils Absolute: 0.1 10*3/uL (ref 0.0–0.1)
Basophils Relative: 1 %
Eosinophils Absolute: 0.4 10*3/uL (ref 0.0–0.5)
Eosinophils Relative: 3 %
HCT: 40.4 % (ref 36.0–46.0)
Hemoglobin: 12.5 g/dL (ref 12.0–15.0)
Immature Granulocytes: 0 %
Lymphocytes Relative: 23 %
Lymphs Abs: 2.8 10*3/uL (ref 0.7–4.0)
MCH: 25.4 pg — ABNORMAL LOW (ref 26.0–34.0)
MCHC: 30.9 g/dL (ref 30.0–36.0)
MCV: 81.9 fL (ref 80.0–100.0)
Monocytes Absolute: 0.7 10*3/uL (ref 0.1–1.0)
Monocytes Relative: 6 %
Neutro Abs: 8.3 10*3/uL — ABNORMAL HIGH (ref 1.7–7.7)
Neutrophils Relative %: 67 %
Platelets: 290 10*3/uL (ref 150–400)
RBC: 4.93 MIL/uL (ref 3.87–5.11)
RDW: 15.5 % (ref 11.5–15.5)
WBC: 12.2 10*3/uL — ABNORMAL HIGH (ref 4.0–10.5)
nRBC: 0 % (ref 0.0–0.2)

## 2020-11-12 LAB — COMPREHENSIVE METABOLIC PANEL
ALT: 21 U/L (ref 0–44)
AST: 17 U/L (ref 15–41)
Albumin: 3.7 g/dL (ref 3.5–5.0)
Alkaline Phosphatase: 77 U/L (ref 38–126)
Anion gap: 7 (ref 5–15)
BUN: 9 mg/dL (ref 6–20)
CO2: 26 mmol/L (ref 22–32)
Calcium: 8.8 mg/dL — ABNORMAL LOW (ref 8.9–10.3)
Chloride: 108 mmol/L (ref 98–111)
Creatinine, Ser: 0.8 mg/dL (ref 0.44–1.00)
GFR, Estimated: >60 mL/min (ref >60)
Glucose, Bld: 98 mg/dL (ref 70–99)
Potassium: 4.1 mmol/L (ref 3.5–5.1)
Sodium: 141 mmol/L (ref 135–145)
Total Bilirubin: 0.5 mg/dL (ref 0.3–1.2)
Total Protein: 6.4 g/dL — ABNORMAL LOW (ref 6.5–8.1)

## 2020-11-12 NOTE — Telephone Encounter (Signed)
Per pt, has not started anastrozole. Pt was not aware that prescription had been filled 05/2020. [facility] for refill. Advised pt to pick up after 2pm. Pt verbalized understanding

## 2020-11-12 NOTE — Telephone Encounter (Signed)
Scheduled appts per 2/3 los. Pt declined print out of AVS and stated she would refer to mychart.

## 2020-11-13 LAB — FOLLICLE STIMULATING HORMONE: FSH: 45.6 m[IU]/mL

## 2020-11-18 ENCOUNTER — Other Ambulatory Visit: Payer: Self-pay | Admitting: Oncology

## 2020-11-18 LAB — ESTRADIOL, ULTRA SENS: Estradiol, Sensitive: 6.8 pg/mL

## 2020-11-23 ENCOUNTER — Other Ambulatory Visit: Payer: Self-pay | Admitting: Oncology

## 2020-11-23 ENCOUNTER — Telehealth: Payer: Self-pay

## 2020-11-23 NOTE — Progress Notes (Unsigned)
Returned call to pt regarding a medication rxn. Pt stated that last week she took 2 days worth of Anastrozole. This was her first fill of the medication. Pt stated that she got a rash on her right hand. Pt stated she stopped the medication and threw the remainder away. Pt states she will not take this medication and will look for natural remedies on the Internet. Pt wanted to make MD aware. Dr. Jana Hakim notified.         Electronically signed by Shawn Stall, RN at 11/23/2020 1:35 PM

## 2020-11-23 NOTE — Telephone Encounter (Signed)
Returned call to pt regarding a medication rxn. Pt stated that last week she took 2 days worth of Anastrozole. This was her first fill of the medication. Pt stated that she got a rash on her right hand. Pt stated she stopped the medication and threw the remainder away. Pt states she will not take this medication and will look for natural remedies on the Internet. Pt wanted to make MD aware. Dr. Jana Hakim notified.

## 2020-11-27 ENCOUNTER — Ambulatory Visit
Admission: RE | Admit: 2020-11-27 | Discharge: 2020-11-27 | Disposition: A | Discharge status: Home / Self Care | Payer: Medicaid Other | Source: Ambulatory Visit | Attending: Adult Health | Admitting: Adult Health

## 2020-11-27 ENCOUNTER — Other Ambulatory Visit: Payer: Self-pay

## 2020-11-27 ENCOUNTER — Ambulatory Visit: Payer: Medicaid Other

## 2020-11-27 DIAGNOSIS — C50412 Malignant neoplasm of upper-outer quadrant of left female breast: Secondary | ICD-10-CM

## 2020-11-27 DIAGNOSIS — Z170 Estrogen receptor positive status [ER+]: Secondary | ICD-10-CM

## 2020-12-08 NOTE — Progress Notes (Signed)
[facility]  Telephone:([phone]) [phone] Fax:([phone]) [phone]    ID: Veronica Conrad DOB: 06/16/1966  MR#: [id number]  DTO#:[id number]  Patient Care Team: Patient, No Pcp Per as PCP - General (General Practice) Mauro Kaufmann, RN as Oncology Nurse Navigator Rockwell Germany, RN as Oncology Nurse Navigator Neelie Welshans, Virgie Dad, MD as Consulting Physician (Oncology) Gery Pray, MD as Consulting Physician (Radiation Oncology) Alphonsa Overall, MD as Consulting Physician (General Surgery) OTHER MD:  I connected with Veronica Conrad on 12/09/20 at  3:30 PM EST by telephone visit and verified that I am speaking with the correct person using two identifiers.   I discussed the limitations, risks, security and privacy concerns of performing an evaluation and management service by telemedicine and the availability of in-person appointments. I also discussed with the patient that there may be a patient responsible charge related to this service. The patient expressed understanding and agreed to proceed.   Other persons participating in the visit and their role in the encounter: None  Patient's location: Home Provider's location: [place]  Total time spent: 15 min   CHIEF COMPLAINT: Ductal carcinoma in situ  CURRENT TREATMENT: Exemestane   INTERVAL HISTORY: Veronica Conrad was contacted today for follow up of her estrogen positive non invasive breast cancer.    She was initially prescribed anastrozole in 05/2020, but she was unaware of the prescription. This was sent in again at her last visit on 11/12/2020. She contacted our office on 11/23/2020 to inform us that she developed a rash on her right hand after taking two days worth of medicine. She stated she stopped the medication and threw the remainder away.  The rash resolved a few days later.  Since her last visit, she underwent bilateral diagnostic mammography with tomography at [facility] on 11/27/2020 showing: breast  density category B; no evidence of malignancy in either breast.    REVIEW OF SYSTEMS: Veronica Conrad tells me she is fine otherwise.  Detailed review of systems today was noncontributory   COVID 19 VACCINATION STATUS:    HISTORY OF CURRENT ILLNESS: From the original intake note:  Veronica Conrad noted diffuse bilateral breast and axillary pain radiating into the axillary regions for 2 months. She underwent bilateral diagnostic mammography with tomography and left breast ultrasonography at [facility] on 01/09/2020 showing: Breast Density Category B. Spot compression views are performed of a possible mass in the lower central portion of the breast. These views confirm presence of a lobulated 8 millimeter mass in the lower central aspect of the left breast, best seen on oblique projection. A tissue marker clip is identified within the lower inner quadrant of the left breast. Magnified views are performed of calcifications in the upper-outer quadrant of the left breast. On magnified views there is a 5 millimeter group of calcifications with linear distribution, and indeterminate. Scattered punctate calcifications are also identified throughout the upper-outer quadrant of the left breast. Targeted ultrasound is performed, showing normal appearing fibroglandular tissue throughout the lower portions of the left breast. No suspicious mass, distortion, or acoustic shadowing is demonstrated with ultrasound.  Accordingly on 02/14/2020 she proceeded to biopsy of the left breast area in question. The pathology from this procedure showed [id number]): ductal carcinoma in situ with calcifications, upper-outer left breast. Prognostic indicators significant for: estrogen receptor, 90% positive and progesterone receptor, 80% positive, both with strong staining intensity.   Additional pathology from a biopsy on 03/16/2020 showed ([id number]): benign breast parenchyma with focal apocrine metaplasia.  She  underwent a  left lumpectomy on 04/10/2020 under Dr. Alphonsa Overall. The pathology from this procedure showed ([id number]): A. BREAST, LEFT, LUMPECTOMY:  - Lobular carcinoma in situ (LCIS).  - Fibrocystic changes with usual ductal hyperplasia and calcifications.  - No ductal carcinoma in situ.  - See comment.  B. BREAST, LEFT LATERAL MARGIN, EXCISION:  - Lobular carcinoma in situ (LCIS).  - Fibrocystic changes with usual ductal hyperplasia and calcifications.  - No ductal carcinoma in situ identified.  - See comment.  COMMENT: In the left breast lumpectomy specimen (part A) there are foci of lobular neoplasia (lobular carcinoma in situ) and E-cadherin is negative supporting the diagnosis. There are also fibrocystic changes with usual ductal hyperplasia and calcifications. In the lumpectomy specimen (part A), the lobular carcinoma in situ is focally less than 0.1 cm from the lateral and posterior margins and in the left lateral margin specimen (part B) less than 0.1 cm from the final lateral margin. No ductal carcinoma in situ is identified and immunohistochemistry for cytokeratin 5/6 supports the diagnosis.   The patient's subsequent history is as detailed below.   PAST MEDICAL HISTORY: Past Medical History:  Diagnosis Date  . Allergy   . Asthma   . Cervical cancer ([place]) 2015  . Family history of colon cancer     PAST SURGICAL HISTORY: Past Surgical History:  Procedure Laterality Date  . ABDOMINAL HYSTERECTOMY    . BREAST LUMPECTOMY WITH RADIOACTIVE SEED LOCALIZATION Left 04/10/2020   Procedure: LEFT BREAST LUMPECTOMY WITH RADIOACTIVE SEED LOCALIZATION;  Surgeon: Alphonsa Overall, MD;  Location: [facility];  Service: General;  Laterality: Left;  The patient has a history of cervical carcinoma diagnosed in 2015 treated with hysterectomy, at radiation with weekly chemotherapy for 6 weeks.   FAMILY HISTORY: Family History  Problem Relation Age of Onset  . Hypertension Mother   .  Heart disease Mother   . High Cholesterol Mother   . Birth defects Mother   . Other Mother        adopted  . Diabetes Maternal Grandfather   . Heart Problems Father        pacemaker  . Colon cancer Paternal Uncle        dx. in his 26s   Veronica Conrad has very little information on her family.  Her mother was adopted.  A parental grandparents had colon cancer.  The patient has 4/2 siblings, all on the father's side, 3 half-sisters and one half brother.  None of them have cancer.  She is not aware of any ovarian breast or pancreatic cancer in the family   GYNECOLOGIC HISTORY:  No LMP recorded. Patient has had a hysterectomy. Menarche: 55 years old Age at first live birth: 55 years old GX P: 3 LMP: Status post hysterectomy age 39 Contraceptive: Status post tubal ligation; also received oral contraceptives without complications remotely HRT: no Hysterectomy?:  yes BSO?:  No   SOCIAL HISTORY: (Current as of 12/09/2020) Veronica Conrad works as a Journalist, newspaper.  She works from home, mostly on the phone.  She moved to [place] from the [place] in 2020.  She lives by herself.  She is divorced.  Her son Barbaraann Rondo lives in the [place] and works in Engineer, technical sales; son Darius lives in [place] and is disabled Nature conservation officer; daughter Charlynn Grimes works in the [place] in [place].  The patient has 5 grandchildren.  She attends a [employer].   ADVANCED DIRECTIVES:     HEALTH  MAINTENANCE: Social History   Tobacco Use  . Smoking status: Former Smoker    Quit date: 2021    Years since quitting: 1.1  . Smokeless tobacco: Never Used  Vaping Use  . Vaping Use: Never used  Substance Use Topics  . Alcohol use: Yes    Comment: occ  . Drug use: Never    Colonoscopy: 2016  PAP: Up-to-date  Bone density:    No Known Allergies  Current Outpatient Medications  Medication Sig Dispense Refill  . exemestane (AROMASIN) 25 MG tablet Take 1 tablet (25 mg total) by mouth daily after  breakfast. 90 tablet 4  . anastrozole (ARIMIDEX) 1 MG tablet Take 1 tablet (1 mg total) by mouth daily. 90 tablet 4  . loratadine (CLARITIN) 10 MG tablet Take 10 mg by mouth daily.     No current facility-administered medications for this visit.    OBJECTIVE: African-American woman who appears stated age  There were no vitals filed for this visit. Wt Readings from Last 3 Encounters:  11/12/20 174 lb (78.9 kg)  06/03/20 185 lb 6.4 oz (84.1 kg)  04/23/20 184 lb 11.2 oz (83.8 kg)   There is no height or weight on file to calculate BMI.    ECOG FS:1 - Symptomatic but completely ambulatory  Telemedicine visit 12/09/2020   LAB RESULTS:  CMP     Component Value Date/Time   NA 141 11/12/2020 1128   K 4.1 11/12/2020 1128   CL 108 11/12/2020 1128   CO2 26 11/12/2020 1128   GLUCOSE 98 11/12/2020 1128   BUN 9 11/12/2020 1128   CREATININE 0.80 11/12/2020 1128   CREATININE 0.75 04/23/2020 1558   CALCIUM 8.8 (L) 11/12/2020 1128   PROT 6.4 (L) 11/12/2020 1128   ALBUMIN 3.7 11/12/2020 1128   AST 17 11/12/2020 1128   AST 28 04/23/2020 1558   ALT 21 11/12/2020 1128   ALT 43 04/23/2020 1558   ALKPHOS 77 11/12/2020 1128   BILITOT 0.5 11/12/2020 1128   BILITOT 0.5 04/23/2020 1558   GFRNONAA >60 11/12/2020 1128   GFRNONAA >60 04/23/2020 1558   GFRAA >60 04/23/2020 1558    Lab Results  Component Value Date   WBC 12.2 (H) 11/12/2020   NEUTROABS 8.3 (H) 11/12/2020   HGB 12.5 11/12/2020   HCT 40.4 11/12/2020   MCV 81.9 11/12/2020   PLT 290 11/12/2020    No results found for: LABCA2  No components found for: GQQPYP950  No results for input(s): INR in the last 168 hours.  No results found for: LABCA2  No results found for: DTO671  No results found for: IWP809  No results found for: XIP382  No results found for: CA2729  No components found for: HGQUANT  No results found for: CEA1 / No results found for: CEA1   No results found for: AFPTUMOR  No results found for:  CHROMOGRNA  No results found for: TOTALPROTELP, ALBUMINELP, A1GS, A2GS, BETS, BETA2SER, GAMS, MSPIKE, SPEI (this displays SPEP labs)  No results found for: KPAFRELGTCHN, LAMBDASER, KAPLAMBRATIO (kappa/lambda light chains)  No results found for: HGBA, HGBA2QUANT, HGBFQUANT, HGBSQUAN (Hemoglobinopathy evaluation)   No results found for: LDH  No results found for: IRON, TIBC, IRONPCTSAT (Iron and TIBC)  No results found for: FERRITIN  Urinalysis No results found for: COLORURINE, APPEARANCEUR, LABSPEC, PHURINE, GLUCOSEU, HGBUR, BILIRUBINUR, KETONESUR, PROTEINUR, UROBILINOGEN, NITRITE, LEUKOCYTESUR   STUDIES:  MM DIAG BREAST TOMO BILATERAL  Result Date: 11/27/2020 CLINICAL DATA:  History of a left lumpectomy for DCIS performed  July 2021. EXAM: DIGITAL DIAGNOSTIC BILATERAL MAMMOGRAM WITH TOMOSYNTHESIS AND CAD TECHNIQUE: Bilateral digital diagnostic mammography and breast tomosynthesis was performed. The images were evaluated with computer-aided detection. COMPARISON:  Previous exam(s). ACR Breast Density Category b: There are scattered areas of fibroglandular density. FINDINGS: Post lumpectomy changes are noted in the lateral left breast. There are no masses or areas of nonsurgical architectural distortion. There are no suspicious calcifications. IMPRESSION: 1. No evidence of new or recurrent breast carcinoma. 2. Benign post lumpectomy changes on the left. RECOMMENDATION: Diagnostic mammogram in 1 year per standard post lumpectomy protocol. (Code:DM-B-01Y) I have discussed the findings and recommendations with the patient. If applicable, a reminder letter will be sent to the patient regarding the next appointment. BI-RADS CATEGORY  2: Benign. Electronically Signed   By: Lajean Manes M.D.   On: 11/27/2020 14:05     ELIGIBLE FOR AVAILABLE RESEARCH PROTOCOL: AET   ASSESSMENT: 55 y.o. Veronica Conrad woman   (1) status post hysterectomy (without salpingo-oophorectomy) 2015 in [place] for cervical  carcinoma, status post adjuvant radiation with weekly chemotherapy x6  (2) status post left breast biopsy 02/14/2020 for ductal carcinoma in situ, grade 2, estrogen and progesterone receptor strongly positive  (a) biopsy of a second suspicious area, lower inner quadrant, of the left breast 03/16/2020 was benign  (3) status post left breast lumpectomy 04/10/2020 showing no residual ductal carcinoma in situ, lobular carcinoma in situ.  (4) genetics counseling 04/23/2020: Patient did not qualify for testing  (5) adjuvant radiation declined by patient  (6) anastrozole initially prescribed August 2021, not started until February 2022  (a) patient discontinued anastrozole after 2 doses because of a rash  (7) exemestane prescribed 12/09/2020   PLAN: I discussed with Ms. Rimmer that anastrozole was not likely to be the cause of the rash in her hand.  When she takes the pill it is absorbed through the whole body so she told me she had a rash over her whole body I would think anastrozole would be a more likely culprit.  However she is quite convinced that anastrozole was the cause of the rash and is not willing to give that medication a second try.  Accordingly we are switching to exemestane.  She is aware of the possible toxicities side effects and complications of this agent and I put in the prescription today.  I will follow up with a virtual visit in about 2 weeks to make sure she is tolerating it well.  Total encounter time 15 minutes.*  Total encounter time: 20 minutes*   Veronica Hogans C. Randle Shatzer, MD 12/09/20 5:10 PM Medical Oncology and Hematology [facility] [place], [place] [address] Tel. [phone]    Fax. [phone]   I, Wilburn Mylar, am acting as scribe for Dr. Virgie Dad. Valentine Kuechle.   *Total Encounter Time as defined by the Centers for Medicare and Medicaid Services includes, in addition to the face-to-face time of a patient visit (documented in  the note above) non-face-to-face time: obtaining and reviewing outside history, ordering and reviewing medications, tests or procedures, care coordination (communications with other health care professionals or caregivers) and documentation in the medical record.

## 2020-12-09 ENCOUNTER — Inpatient Hospital Stay: Payer: Medicaid Other | Attending: Oncology | Admitting: Oncology

## 2020-12-09 DIAGNOSIS — D0512 Intraductal carcinoma in situ of left breast: Secondary | ICD-10-CM | POA: Insufficient documentation

## 2020-12-09 DIAGNOSIS — Z170 Estrogen receptor positive status [ER+]: Secondary | ICD-10-CM | POA: Diagnosis not present

## 2020-12-09 DIAGNOSIS — C50412 Malignant neoplasm of upper-outer quadrant of left female breast: Secondary | ICD-10-CM | POA: Diagnosis not present

## 2020-12-09 MED ORDER — EXEMESTANE 25 MG PO TABS: 25.0000 mg | ORAL_TABLET | Freq: Every day | ORAL | 4 refills | Status: DC

## 2020-12-10 ENCOUNTER — Telehealth: Payer: Self-pay | Admitting: Oncology

## 2020-12-10 NOTE — Telephone Encounter (Signed)
Scheduled appts per 3/2 los. Pt confirmed appt date/time/virutal visit.

## 2020-12-11 ENCOUNTER — Telehealth: Payer: Self-pay

## 2020-12-11 NOTE — Telephone Encounter (Signed)
Pt called in to let Dr. Jana Hakim know that she is not going to take the Aromasin. Pt states that she does not feel the side effects are worth her taking the medication. Pt states that she will discuss this with Dr Jana Hakim at her 12/30/20 video/My Chart visit as well.

## 2020-12-29 NOTE — Progress Notes (Signed)
[place]  [phone] Fax:([phone]) [phone]    ID: Veronica Conrad DOB: [birth year]  MR#: [id number]  QIO#:[id number]  Patient Care Team: Patient, No Pcp Per as PCP - General (General Practice) Mauro Kaufmann, RN as Oncology Nurse Navigator Rockwell Germany, RN as Oncology Nurse Navigator Enedina Pair, Virgie Dad, MD as Consulting Physician (Oncology) Gery Pray, MD as Consulting Physician (Radiation Oncology) Alphonsa Overall, MD as Consulting Physician (General Surgery) OTHER MD:  I connected with Veronica Conrad on 12/30/20 at 12:30 PM EDT by telephone visit and verified that I am speaking with the correct person using two identifiers.   I discussed the limitations, risks, security and privacy concerns of performing an evaluation and management service by telemedicine and the availability of in-person appointments. I also discussed with the patient that there may be a patient responsible charge related to this service. The patient expressed understanding and agreed to proceed.   Other persons participating in the visit and their role in the encounter: None  Patient's location: Home Provider's location: [place]  Total time spent: 15 min*   CHIEF COMPLAINT: Ductal carcinoma in situ  CURRENT TREATMENT: Exemestane   INTERVAL HISTORY: [name] was contacted today for follow up of her estrogen positive non invasive breast cancer.    She was switched to exemestane at her last visit on 12/09/2020.  She had no difficulty obtaining the medication.  Unfortunately when she read the list of possible side effects she decided she did not want to take it.   REVIEW OF SYSTEMS: Veronica Conrad tells me she is doing well, is changing her diet, and is trying to do more exercise on a daily basis.   COVID 19 VACCINATION STATUS:    HISTORY OF CURRENT ILLNESS: From the original intake note:  Veronica Conrad noted diffuse bilateral breast and axillary pain radiating  into the axillary regions for 2 months. She underwent bilateral diagnostic mammography with tomography and left breast ultrasonography at [facility] on 01/09/2020 showing: Breast Density Category B. Spot compression views are performed of a possible mass in the lower central portion of the breast. These views confirm presence of a lobulated 8 millimeter mass in the lower central aspect of the left breast, best seen on oblique projection. A tissue marker clip is identified within the lower inner quadrant of the left breast. Magnified views are performed of calcifications in the upper-outer quadrant of the left breast. On magnified views there is a 5 millimeter group of calcifications with linear distribution, and indeterminate. Scattered punctate calcifications are also identified throughout the upper-outer quadrant of the left breast. Targeted ultrasound is performed, showing normal appearing fibroglandular tissue throughout the lower portions of the left breast. No suspicious mass, distortion, or acoustic shadowing is demonstrated with ultrasound.  Accordingly on 02/14/2020 she proceeded to biopsy of the left breast area in question. The pathology from this procedure showed [id number]): ductal carcinoma in situ with calcifications, upper-outer left breast. Prognostic indicators significant for: estrogen receptor, 90% positive and progesterone receptor, 80% positive, both with strong staining intensity.   Additional pathology from a biopsy on 03/16/2020 showed ([id number]): benign breast parenchyma with focal apocrine metaplasia.  She underwent a left lumpectomy on 04/10/2020 under Dr. Alphonsa Overall. The pathology from this procedure showed ([id number]): A. BREAST, LEFT, LUMPECTOMY:  - Lobular carcinoma in situ (LCIS).  - Fibrocystic changes with usual ductal hyperplasia and calcifications.  - No ductal carcinoma in situ.  - See comment.  B. BREAST, LEFT LATERAL MARGIN,  EXCISION:  - Lobular  carcinoma in situ (LCIS).  - Fibrocystic changes with usual ductal hyperplasia and calcifications.  - No ductal carcinoma in situ identified.  - See comment.  COMMENT: In the left breast lumpectomy specimen (part A) there are foci of lobular neoplasia (lobular carcinoma in situ) and E-cadherin is negative supporting the diagnosis. There are also fibrocystic changes with usual ductal hyperplasia and calcifications. In the lumpectomy specimen (part A), the lobular carcinoma in situ is focally less than 0.1 cm from the lateral and posterior margins and in the left lateral margin specimen (part B) less than 0.1 cm from the final lateral margin. No ductal carcinoma in situ is identified and immunohistochemistry for cytokeratin 5/6 supports the diagnosis.   The patient's subsequent history is as detailed below.   PAST MEDICAL HISTORY: Past Medical History:  Diagnosis Date  . Allergy   . Asthma   . Cervical cancer ([place]) 2015  . Family history of colon cancer     PAST SURGICAL HISTORY: Past Surgical History:  Procedure Laterality Date  . ABDOMINAL HYSTERECTOMY    . BREAST LUMPECTOMY WITH RADIOACTIVE SEED LOCALIZATION Left 04/10/2020   Procedure: LEFT BREAST LUMPECTOMY WITH RADIOACTIVE SEED LOCALIZATION;  Surgeon: Alphonsa Overall, MD;  Location: [facility];  Service: General;  Laterality: Left;  The patient has a history of cervical carcinoma diagnosed in 2015 treated with hysterectomy, at radiation with weekly chemotherapy for 6 weeks.   FAMILY HISTORY: Family History  Problem Relation Age of Onset  . Hypertension Mother   . Heart disease Mother   . High Cholesterol Mother   . Birth defects Mother   . Other Mother        adopted  . Diabetes Maternal Grandfather   . Heart Problems Father        pacemaker  . Colon cancer Paternal Uncle        dx. in his 87s   Veronica Conrad has very little information on her family.  Her mother was adopted.  A parental grandparents had colon  cancer.  The patient has 4/2 siblings, all on the father's side, 3 half-sisters and one half brother.  None of them have cancer.  She is not aware of any ovarian breast or pancreatic cancer in the family   GYNECOLOGIC HISTORY:  No LMP recorded. Patient has had a hysterectomy. Menarche: 55 years old Age at first live birth: 55 years old GX P: 3 LMP: Status post hysterectomy age 67 Contraceptive: Status post tubal ligation; also received oral contraceptives without complications remotely HRT: no Hysterectomy?:  yes BSO?:  No   SOCIAL HISTORY: (Current as of 12/30/2020) Irais works as a Journalist, newspaper.  She works from home, mostly on the phone.  She moved to [place] from the [place] in 2020.  She lives by herself.  She is divorced.  Her son Barbaraann Rondo lives in the [place] and works in Engineer, technical sales; son Darius lives in [place] and is disabled Nature conservation officer; daughter Charlynn Grimes works in the [place] in [place].  The patient has 5 grandchildren.  She attends a [employer].   ADVANCED DIRECTIVES:     HEALTH MAINTENANCE: Social History   Tobacco Use  . Smoking status: Former Smoker    Quit date: 2021    Years since quitting: 1.2  . Smokeless tobacco: Never Used  Vaping Use  . Vaping Use: Never used  Substance Use Topics  . Alcohol use: Yes    Comment: occ  .  Drug use: Never    Colonoscopy: 2016  PAP: Up-to-date  Bone density:    No Known Allergies  Current Outpatient Medications  Medication Sig Dispense Refill  . exemestane (AROMASIN) 25 MG tablet Take 1 tablet (25 mg total) by mouth daily after breakfast. 90 tablet 4  . loratadine (CLARITIN) 10 MG tablet Take 10 mg by mouth daily.     No current facility-administered medications for this visit.    OBJECTIVE:   There were no vitals filed for this visit. Wt Readings from Last 3 Encounters:  11/12/20 174 lb (78.9 kg)  06/03/20 185 lb 6.4 oz (84.1 kg)  04/23/20 184 lb 11.2 oz (83.8 kg)    There is no height or weight on file to calculate BMI.    ECOG FS:1 - Symptomatic but completely ambulatory  Telemedicine visit 12/30/2020   LAB RESULTS:  CMP     Component Value Date/Time   NA 141 11/12/2020 1128   K 4.1 11/12/2020 1128   CL 108 11/12/2020 1128   CO2 26 11/12/2020 1128   GLUCOSE 98 11/12/2020 1128   BUN 9 11/12/2020 1128   CREATININE 0.80 11/12/2020 1128   CREATININE 0.75 04/23/2020 1558   CALCIUM 8.8 (L) 11/12/2020 1128   PROT 6.4 (L) 11/12/2020 1128   ALBUMIN 3.7 11/12/2020 1128   AST 17 11/12/2020 1128   AST 28 04/23/2020 1558   ALT 21 11/12/2020 1128   ALT 43 04/23/2020 1558   ALKPHOS 77 11/12/2020 1128   BILITOT 0.5 11/12/2020 1128   BILITOT 0.5 04/23/2020 1558   GFRNONAA >60 11/12/2020 1128   GFRNONAA >60 04/23/2020 1558   GFRAA >60 04/23/2020 1558    Lab Results  Component Value Date   WBC 12.2 (H) 11/12/2020   NEUTROABS 8.3 (H) 11/12/2020   HGB 12.5 11/12/2020   HCT 40.4 11/12/2020   MCV 81.9 11/12/2020   PLT 290 11/12/2020    No results found for: LABCA2  No components found for: [id number]  No results for input(s): INR in the last 168 hours.  No results found for: LABCA2  No results found for: JKK938  No results found for: HWE993  No results found for: ZJI967  No results found for: CA2729  No components found for: HGQUANT  No results found for: CEA1 / No results found for: CEA1   No results found for: AFPTUMOR  No results found for: CHROMOGRNA  No results found for: TOTALPROTELP, ALBUMINELP, A1GS, A2GS, BETS, BETA2SER, GAMS, MSPIKE, SPEI (this displays SPEP labs)  No results found for: KPAFRELGTCHN, LAMBDASER, KAPLAMBRATIO (kappa/lambda light chains)  No results found for: HGBA, HGBA2QUANT, HGBFQUANT, HGBSQUAN (Hemoglobinopathy evaluation)   No results found for: LDH  No results found for: IRON, TIBC, IRONPCTSAT (Iron and TIBC)  No results found for: FERRITIN  Urinalysis No results found for:  COLORURINE, APPEARANCEUR, LABSPEC, PHURINE, GLUCOSEU, HGBUR, BILIRUBINUR, KETONESUR, PROTEINUR, UROBILINOGEN, NITRITE, LEUKOCYTESUR   STUDIES:  No results found.   ELIGIBLE FOR AVAILABLE RESEARCH PROTOCOL: AET   ASSESSMENT: 55 y.o. [place] woman   (1) status post hysterectomy (without salpingo-oophorectomy) 2015 in [place] for cervical carcinoma, status post adjuvant radiation with weekly chemotherapy x6  (2) status post left breast biopsy 02/14/2020 for ductal carcinoma in situ, grade 2, estrogen and progesterone receptor strongly positive  (a) biopsy of a second suspicious area, lower inner quadrant, of the left breast 03/16/2020 was benign  (3) status post left breast lumpectomy 04/10/2020 showing no residual ductal carcinoma in situ, lobular carcinoma in situ.  (4) genetics  counseling 04/23/2020: Patient did not qualify for testing  (5) adjuvant radiation declined by patient  (6) anastrozole initially prescribed August 2021, not started until February 2022  (a) patient discontinued anastrozole after 2 doses because of a rash  (7) exemestane prescribed 12/09/2020   PLAN: Ms. Hunsberger is afraid she is going to develop many of the symptoms that could possibly develop from exemestane.  We discussed the fact that she does not have to have any of those symptoms: She could simply try the medication and if she has a symptoms then she can stop it.  However she is not willing to give it a try.  She is going to focus on diet and exercise which of course is in itself a good thing.  She is agreeable to further follow-up here.  We will see her again in October.  She knows to call for any other issue that may develop before then.  Virgie Dad. Kana Reimann, MD 12/30/20 5:41 PM Medical Oncology and Hematology [facility] [place], [place] [address] Tel. [phone]    Fax. [phone]   I, Wilburn Mylar, am acting as scribe for Dr. Virgie Dad. Stanley Helmuth.  I,  Lurline Del MD, have reviewed the above documentation for accuracy and completeness, and I agree with the above.    *Total Encounter Time as defined by the Centers for Medicare and Medicaid Services includes, in addition to the face-to-face time of a patient visit (documented in the note above) non-face-to-face time: obtaining and reviewing outside history, ordering and reviewing medications, tests or procedures, care coordination (communications with other health care professionals or caregivers) and documentation in the medical record.

## 2020-12-30 ENCOUNTER — Inpatient Hospital Stay (HOSPITAL_BASED_OUTPATIENT_CLINIC_OR_DEPARTMENT_OTHER): Payer: Medicaid Other | Admitting: Oncology

## 2020-12-30 DIAGNOSIS — Z170 Estrogen receptor positive status [ER+]: Secondary | ICD-10-CM | POA: Diagnosis not present

## 2020-12-30 DIAGNOSIS — C50412 Malignant neoplasm of upper-outer quadrant of left female breast: Secondary | ICD-10-CM | POA: Diagnosis not present

## 2020-12-31 ENCOUNTER — Telehealth: Payer: Self-pay | Admitting: Oncology

## 2020-12-31 NOTE — Telephone Encounter (Signed)
Scheduled appt per 3/23 los. Pt aware.  

## 2021-02-01 ENCOUNTER — Other Ambulatory Visit: Payer: Self-pay | Admitting: Oncology

## 2021-03-09 ENCOUNTER — Telehealth: Payer: Self-pay | Admitting: Adult Health

## 2021-03-09 NOTE — Telephone Encounter (Signed)
Scheduled appointment per provider. Patient is aware. 

## 2021-03-11 ENCOUNTER — Inpatient Hospital Stay: Payer: Medicaid Other | Admitting: Adult Health

## 2021-04-08 ENCOUNTER — Encounter: Payer: Medicaid Other | Admitting: Adult Health

## 2021-04-08 NOTE — Progress Notes (Deleted)
SURVIVORSHIP VIRTUAL VISIT:  I connected with Veronica Conrad on 04/08/21 at  2:00 PM EDT by my chart video and verified that I am speaking with the correct person using two identifiers.  I discussed the limitations, risks, security and privacy concerns of performing an evaluation and management service by telephone and the availability of in person appointments. I also discussed with the patient that there may be a patient responsible charge related to this service. The patient expressed understanding and agreed to proceed.   Patient location: home Provider location: [facility] office Others participating in the call: none  BRIEF ONCOLOGIC HISTORY:  Oncology History  Malignant neoplasm of upper-outer quadrant of left breast in female, estrogen receptor positive ([place])  02/14/2020 Initial Biopsy   status post left breast biopsy 02/14/2020 for ductal carcinoma in situ, grade 2, estrogen and progesterone receptor strongly positive             (a) biopsy of a second suspicious area, lower inner quadrant, of the left breast 03/16/2020 was benign   03/16/2020 Cancer Staging   Staging form: Breast, AJCC 8th Edition - Clinical stage from 03/16/2020: Stage 0 (cTis (DCIS), cN0, cM0, ER+, PR+) - Signed by Gardenia Phlegm, NP on 03/18/2020    04/10/2020 Surgery   status post left breast lumpectomy 04/10/2020 showing no residual ductal carcinoma in situ, lobular carcinoma in situ.     04/18/2020 -  Radiation Therapy   Declined by patient    04/23/2020 Genetic Testing   genetics counseling 04/23/2020: Patient did not qualify for testing   11/2020 -  Anti-estrogen oral therapy   Anastrozole started 11/2020, discontinued secondary to rash then started Exemestane     INTERVAL HISTORY:  Veronica Conrad to review her survivorship care plan detailing her treatment course for breast cancer, as well as monitoring long-term side effects of that treatment, education regarding health maintenance, screening, and overall  wellness and health promotion.     Overall, Veronica Conrad reports feeling quite well   REVIEW OF SYSTEMS:  Review of Systems  Constitutional:  Negative for appetite change, chills, fatigue, fever and unexpected weight change.  HENT:   Negative for hearing loss, lump/mass and trouble swallowing.   Eyes:  Negative for eye problems and icterus.  Respiratory:  Negative for chest tightness, cough and shortness of breath.   Cardiovascular:  Negative for chest pain, leg swelling and palpitations.  Gastrointestinal:  Negative for abdominal distention, abdominal pain, constipation, diarrhea, nausea and vomiting.  Endocrine: Negative for hot flashes.  Genitourinary:  Negative for difficulty urinating.   Musculoskeletal:  Negative for arthralgias.  Skin:  Negative for itching and rash.  Neurological:  Negative for dizziness, extremity weakness, headaches and numbness.  Hematological:  Negative for adenopathy. Does not bruise/bleed easily.  Psychiatric/Behavioral:  Negative for depression. The patient is not nervous/anxious.   Breast: Denies any new nodularity, masses, tenderness, nipple changes, or nipple discharge.      ONCOLOGY TREATMENT TEAM:  1. Surgeon:  Dr. Lucia Gaskins at [facility] 2. Medical Oncologist: Dr. Jana Hakim  3. Radiation Oncologist: Dr. Sondra Come    PAST MEDICAL/SURGICAL HISTORY:  Past Medical History:  Diagnosis Date   Allergy    Asthma    Cervical cancer ([place]) 2015   Family history of colon cancer    Past Surgical History:  Procedure Laterality Date   ABDOMINAL HYSTERECTOMY     BREAST LUMPECTOMY WITH RADIOACTIVE SEED LOCALIZATION Left 04/10/2020   Procedure: LEFT BREAST LUMPECTOMY WITH RADIOACTIVE SEED LOCALIZATION;  Surgeon: Lucia Gaskins,  Shanon Brow, MD;  Location: [facility];  Service: General;  Laterality: Left;     ALLERGIES:  No Known Allergies   CURRENT MEDICATIONS:  Outpatient Encounter Medications as of 04/08/2021  Medication Sig   exemestane  (AROMASIN) 25 MG tablet Take 1 tablet (25 mg total) by mouth daily after breakfast.   loratadine (CLARITIN) 10 MG tablet Take 10 mg by mouth daily.   No facility-administered encounter medications on file as of 04/08/2021.     ONCOLOGIC FAMILY HISTORY:  Family History  Problem Relation Age of Onset   Hypertension Mother    Heart disease Mother    High Cholesterol Mother    Birth defects Mother    Other Mother        adopted   Diabetes Maternal Grandfather    Heart Problems Father        pacemaker   Colon cancer Paternal Uncle        dx. in his 65s     GENETIC COUNSELING/TESTING: Not recommended at this time  SOCIAL HISTORY:  Social History   Socioeconomic History   Marital status: Single    Spouse name: Not on file   Number of children: Not on file   Years of education: Not on file   Highest education level: 12th grade  Occupational History   Not on file  Tobacco Use   Smoking status: Former    Pack years: 0.00    Types: Cigarettes    Quit date: 2021    Years since quitting: 1.4   Smokeless tobacco: Never  Vaping Use   Vaping Use: Never used  Substance and Sexual Activity   Alcohol use: Yes    Comment: occ   Drug use: Never   Sexual activity: Not Currently    Birth control/protection: Surgical  Other Topics Concern   Not on file  Social History Narrative   Not on file   Social Determinants of Health   Financial Resource Strain: Not on file  Food Insecurity: Not on file  Transportation Needs: Not on file  Physical Activity: Not on file  Stress: Not on file  Social Connections: Not on file  Intimate Partner Violence: Not on file     OBSERVATIONS/OBJECTIVE:  Patient appears well and is in no apparent distress, mood and behavior are normal.  Breathing is non labored. Skin visualized without rash or lesion.  LABORATORY DATA:  None for this visit.  DIAGNOSTIC IMAGING:  None for this visit.      ASSESSMENT AND PLAN:  Ms.. Conrad is a pleasant  55 y.o. female with Stage 0 left breast DCIS, ER+/PR+, diagnosed in 02/2020, treated with lumpectomy, and anti-estrogen therapy with aromatase inhibitors beginning in 11/2020.  She presents to the Survivorship Clinic for our initial meeting and routine follow-up post-completion of treatment for breast cancer.    1. Stage 0 left breast cancer:  Ms. Ryle is continuing to recover from definitive treatment for breast cancer. She will follow-up with her medical oncologist, Dr. Ross Ludwig in *** with history and physical exam per surveillance protocol.  She will continue her anti-estrogen therapy with ***. Thus far, she is tolerating the *** well, with minimal side effects. She was instructed to make Dr. Lindi Adie or myself aware if she begins to experience any worsening side effects of the medication and I could see her back in clinic to help manage those side effects, as needed. Her mammogram is due 11/2021; orders placed today. . Today, a comprehensive survivorship care plan  and treatment summary was reviewed with the patient today detailing her breast cancer diagnosis, treatment course, potential late/long-term effects of treatment, appropriate follow-up care with recommendations for the future, and patient education resources.  A copy of this summary, along with a letter will be sent to the patient's primary care provider via mail/fax/In Basket message after today's visit.    #. Problem(s) at Visit______________  #. Bone health:  Given Ms. Green's age/history of breast cancer and her current treatment regimen including anti-estrogen therapy with ***, she is at risk for bone demineralization.  Her last DEXA scan was ***, which showed ***.  In the meantime, she was encouraged to increase her consumption of foods rich in calcium, as well as increase her weight-bearing activities.  She was given education on specific activities to promote bone health.  #. Cancer screening:  Due to Ms. Pringle's history and  her age, she should receive screening for skin cancers, colon cancer, and gynecologic cancers.  The information and recommendations are listed on the patient's comprehensive care plan/treatment summary and were reviewed in detail with the patient.    #. Health maintenance and wellness promotion: Ms. Magistro was encouraged to consume 5-7 servings of fruits and vegetables per day. We reviewed the "Nutrition Rainbow" handout, as well as the handout "Take Control of Your Health and Reduce Your Cancer Risk" from the [facility].  She was also encouraged to engage in moderate to vigorous exercise for 30 minutes per day most days of the week. We discussed the [employer] fitness program, which is designed for cancer survivors to help them become more physically fit after cancer treatments.  She was instructed to limit her alcohol consumption and continue to abstain from tobacco use/***was encouraged stop smoking.     #. Support services/counseling: It is not uncommon for this period of the patient's cancer care trajectory to be one of many emotions and stressors.  We discussed how this can be increasingly difficult during the times of quarantine and social distancing due to the COVID-19 pandemic.   She was given information regarding our available services and encouraged to contact me with any questions or for help enrolling in any of our support group/programs.    Follow up instructions:    -Return to cancer center ***  -Mammogram due in 11/2021 -Follow up with surgery *** -She is welcome to return back to the Survivorship Clinic at any time; no additional follow-up needed at this time.  -Consider referral back to survivorship as a long-term survivor for continued surveillance  The patient was provided an opportunity to ask questions and all were answered. The patient agreed with the plan and demonstrated an understanding of the instructions.   Total encounter time: *** minutes  Wilber Bihari, NP 04/08/21 1:10 PM Medical Oncology and Hematology [facility] [place], [place] [address] Tel. [phone]    Fax. [phone]  *Total Encounter Time as defined by the Centers for Medicare and Medicaid Services includes, in addition to the face-to-face time of a patient visit (documented in the note above) non-face-to-face time: obtaining and reviewing outside history, ordering and reviewing medications, tests or procedures, care coordination (communications with other health care professionals or caregivers) and documentation in the medical record.

## 2021-04-19 ENCOUNTER — Encounter (HOSPITAL_COMMUNITY): Payer: Self-pay

## 2021-07-29 ENCOUNTER — Encounter: Payer: Self-pay | Admitting: Physician Assistant

## 2021-08-05 ENCOUNTER — Inpatient Hospital Stay: Payer: Medicaid Other | Admitting: Oncology

## 2021-08-18 ENCOUNTER — Encounter: Payer: Self-pay | Admitting: Physician Assistant

## 2021-08-18 ENCOUNTER — Ambulatory Visit (INDEPENDENT_AMBULATORY_CARE_PROVIDER_SITE_OTHER): Payer: Medicaid Other | Admitting: Physician Assistant

## 2021-08-18 VITALS — BP 120/84 | HR 108 | Ht 60.5 in | Wt 177.0 lb

## 2021-08-18 DIAGNOSIS — R194 Change in bowel habit: Secondary | ICD-10-CM | POA: Diagnosis not present

## 2021-08-18 DIAGNOSIS — Z86010 Personal history of colonic polyps: Secondary | ICD-10-CM

## 2021-08-18 DIAGNOSIS — R109 Unspecified abdominal pain: Secondary | ICD-10-CM

## 2021-08-18 MED ORDER — NA SULFATE-K SULFATE-MG SULF 17.5-3.13-1.6 GM/177ML PO SOLN: 1.0000 | Freq: Once | ORAL | 0 refills | Status: AC

## 2021-08-18 NOTE — Patient Instructions (Signed)
You have been scheduled for a colonoscopy. Please follow written instructions given to you at your visit today.  Please pick up your prep supplies at the pharmacy within the next 1-3 days. If you use inhalers (even only as needed), please bring them with you on the day of your procedure.  If you are age 53 or older, your body mass index should be between 23-30. Your Body mass index is 34 kg/m. If this is out of the aforementioned range listed, please consider follow up with your Primary Care Provider.  If you are age 75 or younger, your body mass index should be between 19-25. Your Body mass index is 34 kg/m. If this is out of the aformentioned range listed, please consider follow up with your Primary Care Provider.   ________________________________________________________  The [facility] providers would like to encourage you to use [facility] to communicate with providers for non-urgent requests or questions.  Due to long hold times on the telephone, sending your provider a message by [facility] may be a faster and more efficient way to get a response.  Please allow 48 business hours for a response.  Please remember that this is for non-urgent requests.  _______________________________________________________

## 2021-08-18 NOTE — Progress Notes (Signed)
I agree with the above note, plan 

## 2021-08-18 NOTE — Progress Notes (Signed)
Chief Complaint: Left-sided abdominal pain  HPI:    [name] is a 55 year old female with a past medical history as listed below including a family history of colon cancer, personal history of breast cancer, who was referred to me by Audley Hose, MD for a complaint of left-sided abdominal pain.    Today, the patient presents to clinic and tells me that she has a history of cervical cancer for which she had chemo and radiation in 2015.  She recently moved here from [place] about 2 years ago.  Tells me that ever since she had her chemo and radiation she has had somewhat of an alteration in bowel habits which seems to have worsened since August of this year.  Along with this is experiencing a new left-sided abdominal pain for which she was seen in urgent care and given Flexeril.  Tells me that this medicine does help.  She previously worked in a [employer] doing a lot of pulling and lifting.  Tells me the abdominal pain is unchanged with a bowel movement and is sharp/constant and 8/10.     Also describes a change in bowel habits towards multiple stools per day, often 6-8 but these are all typically solid/soft with some diarrhea in between depending on what she eats.    Also describes some variance in stool color from very dark to lighter.       Does describe a family history of colon cancer in her uncle.  Previously had a colonoscopy in 2016 and had 3 polyps which she were told was "noncancerous".  She was not sure when she was supposed to repeat this.    Denies fever, chills, weight loss, blood in her stool or symptoms that awaken her from sleep.  Past Medical History:  Diagnosis Date   Allergy    Asthma    Cervical cancer ([place]) 2015   Family history of colon cancer     Past Surgical History:  Procedure Laterality Date   ABDOMINAL HYSTERECTOMY     BREAST LUMPECTOMY WITH RADIOACTIVE SEED LOCALIZATION Left 04/10/2020   Procedure: LEFT BREAST LUMPECTOMY WITH RADIOACTIVE SEED LOCALIZATION;   Surgeon: Alphonsa Overall, MD;  Location: [facility];  Service: General;  Laterality: Left;    Current Outpatient Medications  Medication Sig Dispense Refill   exemestane (AROMASIN) 25 MG tablet Take 1 tablet (25 mg total) by mouth daily after breakfast. 90 tablet 4   loratadine (CLARITIN) 10 MG tablet Take 10 mg by mouth daily.     No current facility-administered medications for this visit.    Allergies as of 08/18/2021   (No Known Allergies)    Family History  Problem Relation Age of Onset   Hypertension Mother    Heart disease Mother    High Cholesterol Mother    Birth defects Mother    Other Mother        adopted   Diabetes Maternal Grandfather    Heart Problems Father        pacemaker   Colon cancer Paternal Uncle        dx. in his 20s    Social History   Socioeconomic History   Marital status: Single    Spouse name: Not on file   Number of children: Not on file   Years of education: Not on file   Highest education level: 12th grade  Occupational History   Not on file  Tobacco Use   Smoking status: Former    Types: Cigarettes  Quit date: 2021    Years since quitting: 1.8   Smokeless tobacco: Never  Vaping Use   Vaping Use: Never used  Substance and Sexual Activity   Alcohol use: Yes    Comment: occ   Drug use: Never   Sexual activity: Not Currently    Birth control/protection: Surgical  Other Topics Concern   Not on file  Social History Narrative   Not on file   Social Determinants of Health   Financial Resource Strain: Not on file  Food Insecurity: Not on file  Transportation Needs: Not on file  Physical Activity: Not on file  Stress: Not on file  Social Connections: Not on file  Intimate Partner Violence: Not on file    Review of Systems:    Constitutional: No weight loss, fever or chills Cardiovascular: No chest pain Respiratory: No SOB Gastrointestinal: See HPI and otherwise negative Genitourinary: No  dysuria Neurological: No headache, dizziness or syncope Musculoskeletal: No new muscle or joint pain Hematologic: No bleeding  Psychiatric: No history of depression or anxiety   Physical Exam:  Vital signs: BP 120/84 (BP Location: Left Arm, Patient Position: Sitting, Cuff Size: Normal)   Pulse (!) 108   Ht 5' 0.5" (1.537 m) Comment: height measured without shoes  Wt 177 lb (80.3 kg)   BMI 34.00 kg/m    Constitutional:   Pleasant overweight AA female appears to be in NAD, Well developed, Well nourished, alert and cooperative Head:  Normocephalic and atraumatic. Eyes:   PEERL, EOMI. No icterus. Conjunctiva pink. Ears:  Normal auditory acuity. Neck:  Supple Throat: Oral cavity and pharynx without inflammation, swelling or lesion.  Respiratory: Respirations even and unlabored. Lungs clear to auscultation bilaterally.   No wheezes, crackles, or rhonchi.  Cardiovascular: Normal S1, S2. No MRG. Regular rate and rhythm. No peripheral edema, cyanosis or pallor.  Gastrointestinal:  Soft, nondistended, mild left flank pain, tender to palpation. No rebound or guarding. Normal bowel sounds. No appreciable masses or hepatomegaly. Rectal:  Not performed.  Msk:  Symmetrical without gross deformities. Without edema, no deformity or joint abnormality.  Neurologic:  Alert and  oriented x4;  grossly normal neurologically.  Skin:   Dry and intact without significant lesions or rashes. Psychiatric:  Demonstrates good judgement and reason without abnormal affect or behaviors.  RELEVANT LABS AND IMAGING: CBC    Component Value Date/Time   WBC 12.2 (H) 11/12/2020 1128   RBC 4.93 11/12/2020 1128   HGB 12.5 11/12/2020 1128   HGB 12.4 04/23/2020 1558   HCT 40.4 11/12/2020 1128   PLT 290 11/12/2020 1128   PLT 283 04/23/2020 1558   MCV 81.9 11/12/2020 1128   MCH 25.4 (L) 11/12/2020 1128   MCHC 30.9 11/12/2020 1128   RDW 15.5 11/12/2020 1128   LYMPHSABS 2.8 11/12/2020 1128   MONOABS 0.7 11/12/2020  1128   EOSABS 0.4 11/12/2020 1128   BASOSABS 0.1 11/12/2020 1128    CMP     Component Value Date/Time   NA 141 11/12/2020 1128   K 4.1 11/12/2020 1128   CL 108 11/12/2020 1128   CO2 26 11/12/2020 1128   GLUCOSE 98 11/12/2020 1128   BUN 9 11/12/2020 1128   CREATININE 0.80 11/12/2020 1128   CREATININE 0.75 04/23/2020 1558   CALCIUM 8.8 (L) 11/12/2020 1128   PROT 6.4 (L) 11/12/2020 1128   ALBUMIN 3.7 11/12/2020 1128   AST 17 11/12/2020 1128   AST 28 04/23/2020 1558   ALT 21 11/12/2020 1128   ALT  43 04/23/2020 1558   ALKPHOS 77 11/12/2020 1128   BILITOT 0.5 11/12/2020 1128   BILITOT 0.5 04/23/2020 1558   GFRNONAA >60 11/12/2020 1128   GFRNONAA >60 04/23/2020 1558   GFRAA >60 04/23/2020 1558    Assessment: 1.  Change in bowel habits: Towards more frequent, still soft/solid about 6-8 times a day over the past 3 months, previous history of cervical and breast cancer, last colonoscopy 2016 per the patient; consider IBS versus diet versus malignancy 2.  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  Scheduled patient for diagnostic colonoscopy in the [place] with Dr. Ardis Hughs.  Did provide the patient a detailed list of risks for the procedure and she agrees to proceed. Patient is appropriate for endoscopic procedure(s) in the ambulatory ([facility]) setting.  2.  Recommend patient start a fiber supplement such as Metamucil, Citrucel or Benefiber once daily 3.  We will try to get patient's last colonoscopy records. 4.  Patient to follow in clinic per recommendations after time of procedure.  Dr. Ardis Hughs will be her primary GI physician.  Ellouise Newer, PA-C [facility] 08/18/2021, 8:26 AM  Cc: Audley Hose, MD

## 2021-09-17 IMAGING — MG DIGITAL DIAGNOSTIC BILAT W/ TOMO W/ CAD
6 of 9 series · 6 of 25 positions shown · non-contrast
Comparison: Previous exam(s).

CLINICAL DATA: History of a left lumpectomy for DCIS performed April 2020.

EXAM:
DIGITAL DIAGNOSTIC BILATERAL MAMMOGRAM WITH TOMOSYNTHESIS AND CAD
TECHNIQUE: Bilateral digital diagnostic mammography and breast tomosynthesis
was performed. The images were evaluated with computer-aided
detection.

[L CC]
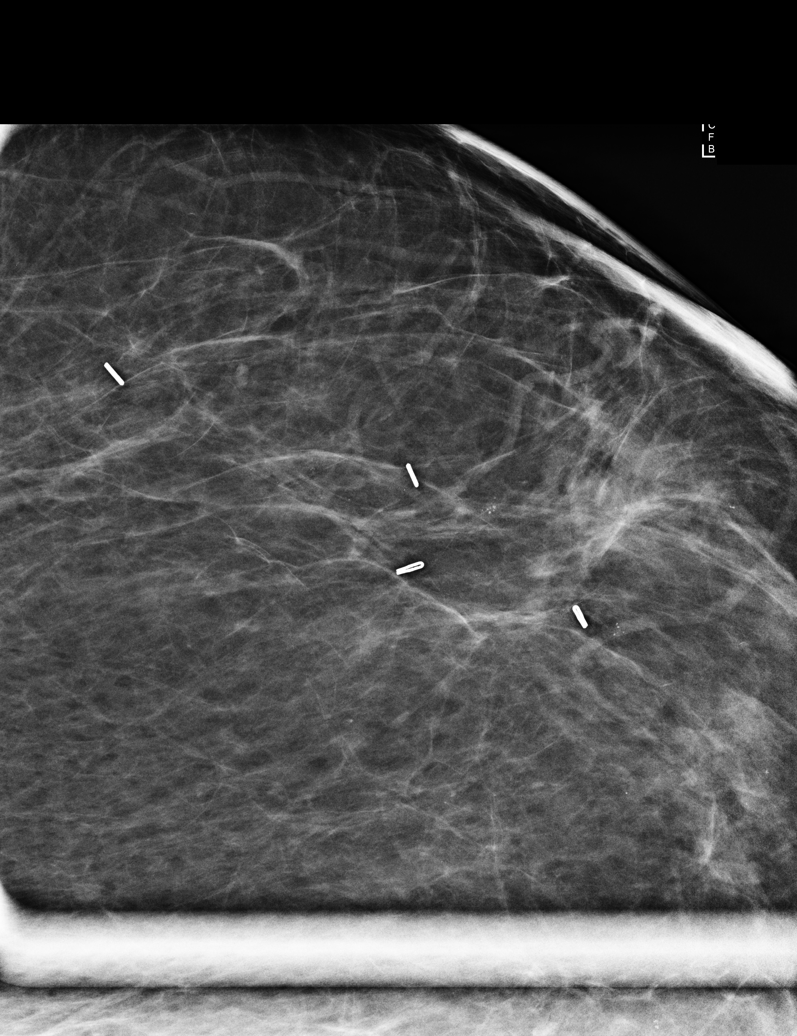

[L MLO synth-2D]
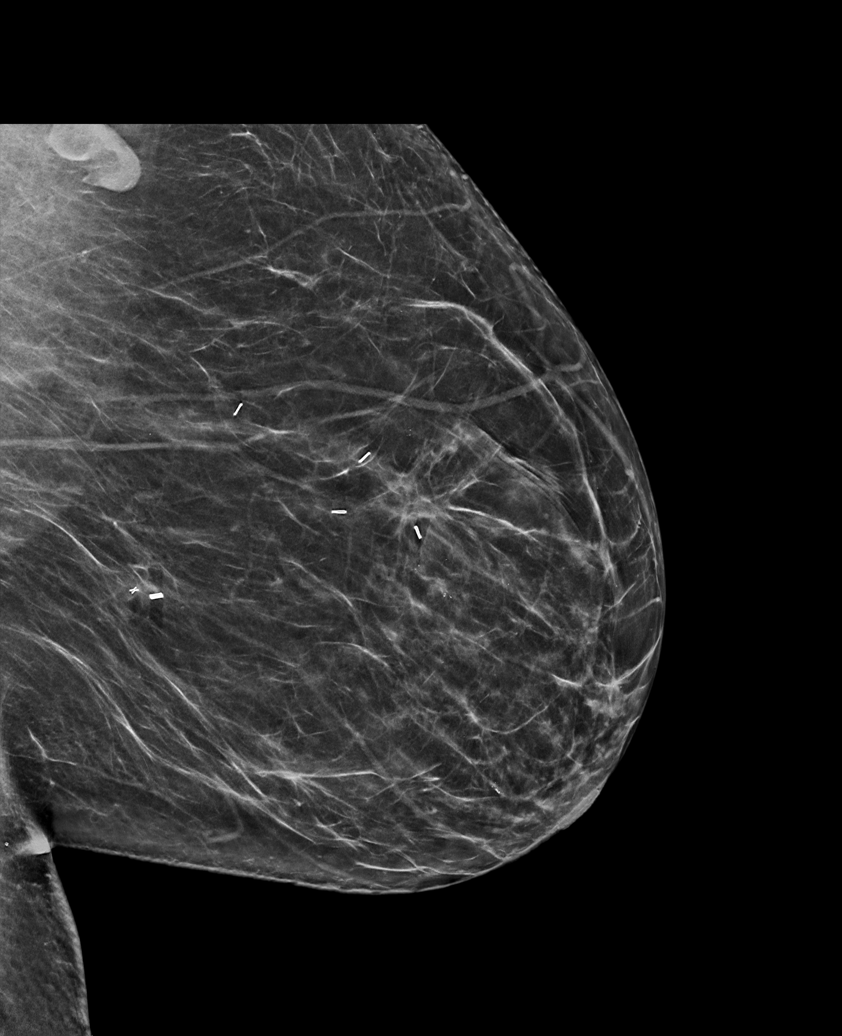

[L CC synth-2D]
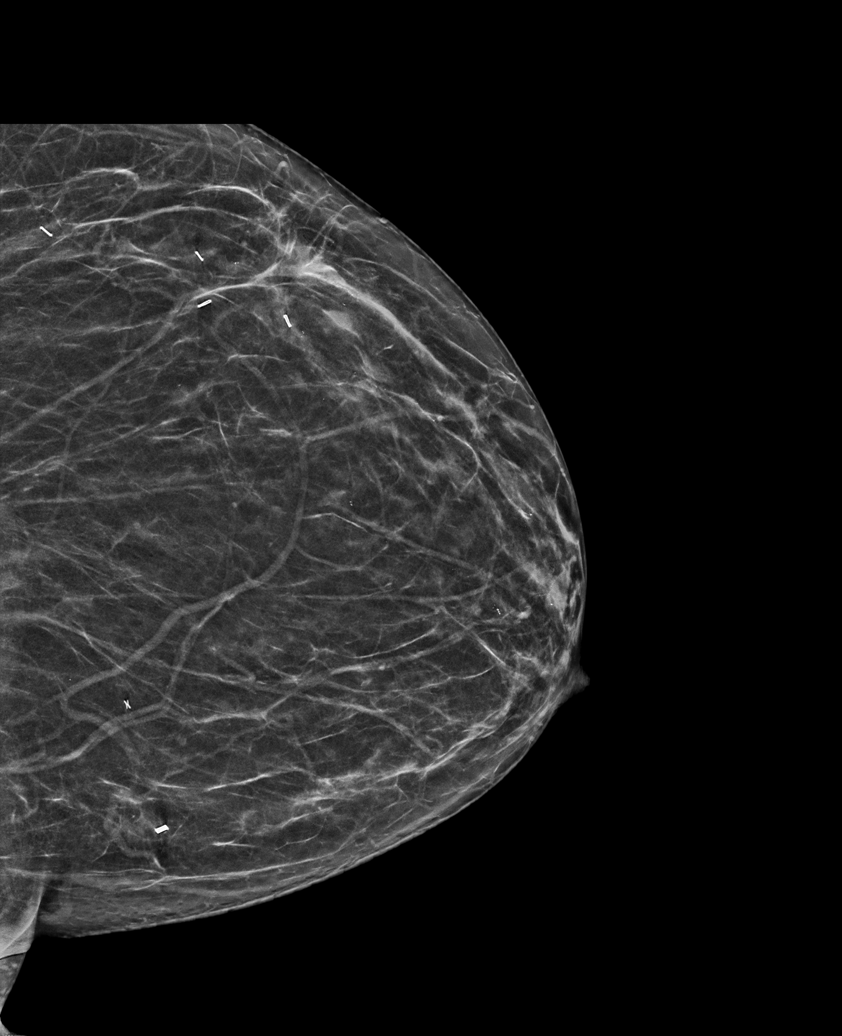

[R CC synth-2D]
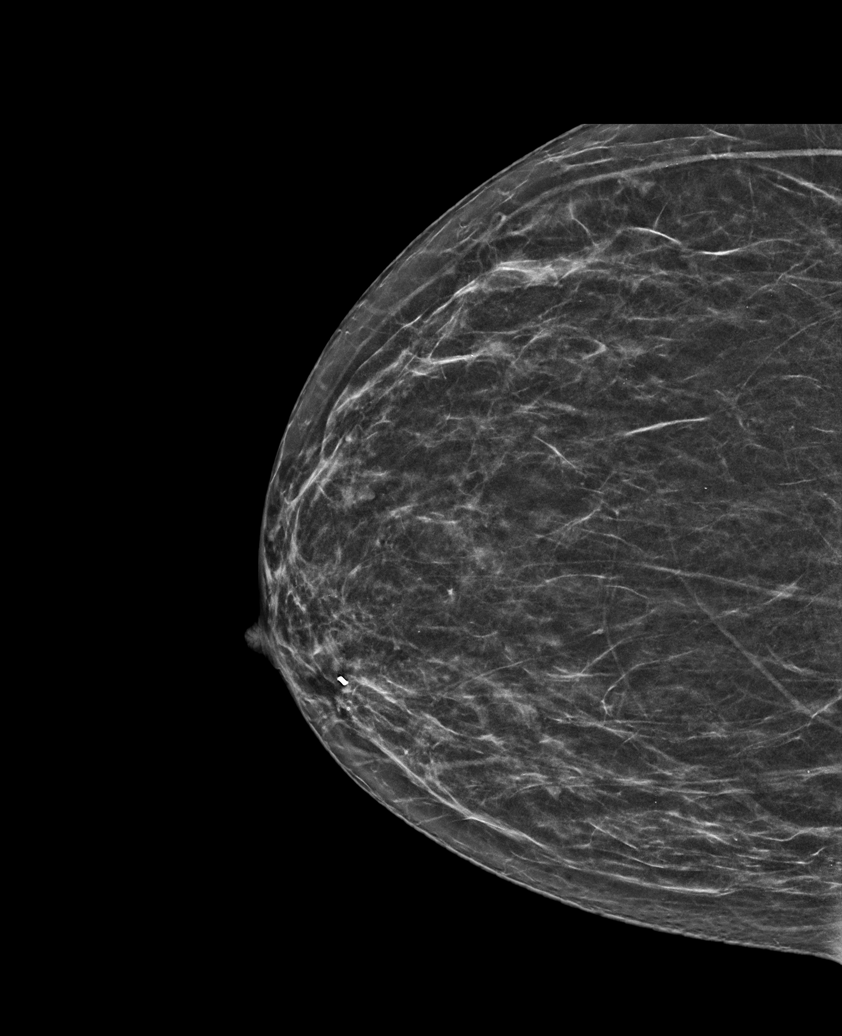

[R MLO synth-2D]
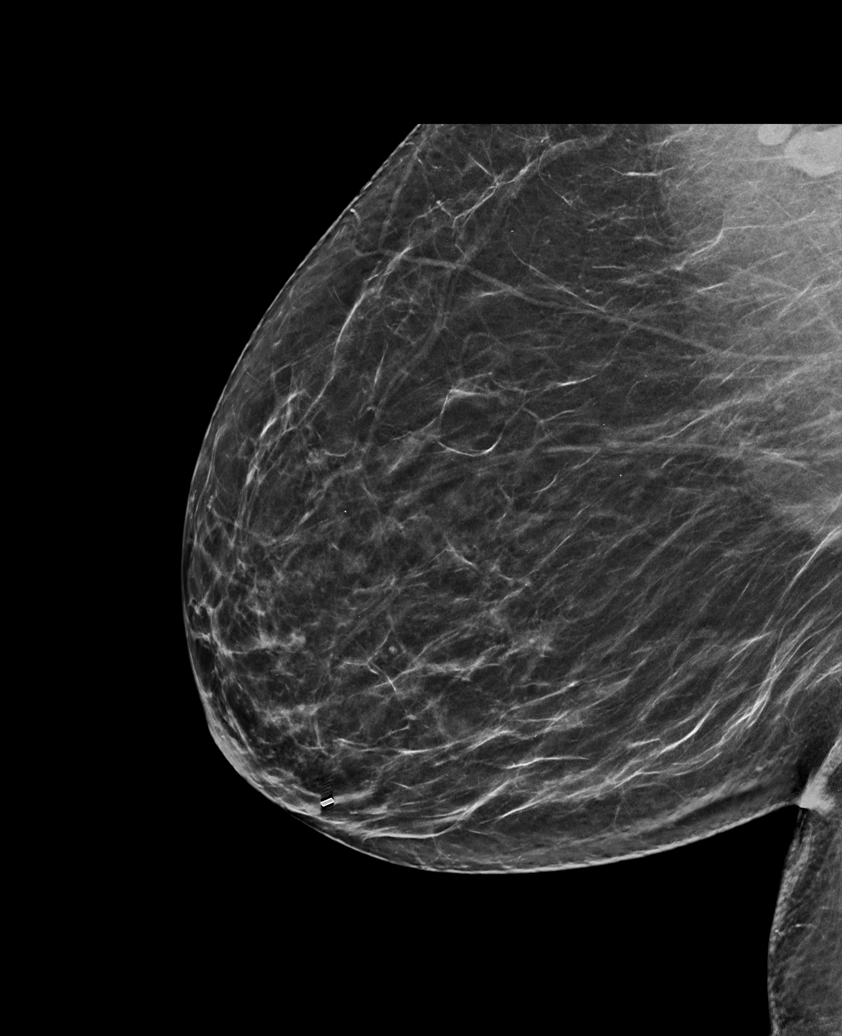

[L MLO tomo · tomo slice 35/68.0]
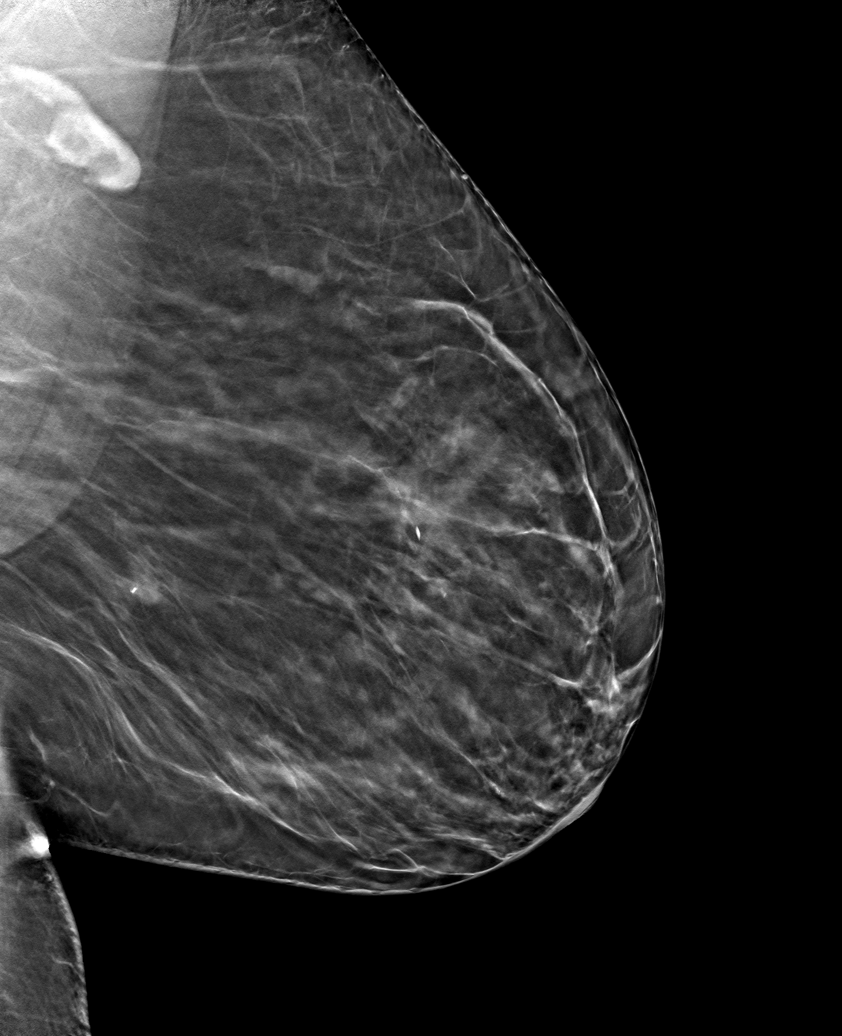

[6 of 25 positions shown; findings below may reference images not displayed]

ACR Breast Density Category b: There are scattered areas of
fibroglandular density.
FINDINGS: Post lumpectomy changes are noted in the lateral left breast.

There are no masses or areas of nonsurgical architectural
distortion. There are no suspicious calcifications.
IMPRESSION: 1. No evidence of new or recurrent breast carcinoma.
2. Benign post lumpectomy changes on the left.

RECOMMENDATION:
Diagnostic mammogram in 1 year per standard post lumpectomy
protocol. (Code:[id number]-[id number])

I have discussed the findings and recommendations with the patient.
If applicable, a reminder letter will be sent to the patient
regarding the next appointment.

BI-RADS CATEGORY  2: Benign.

## 2021-09-30 ENCOUNTER — Telehealth: Payer: Self-pay | Admitting: Gastroenterology

## 2021-09-30 NOTE — Telephone Encounter (Signed)
ERROR

## 2021-09-30 NOTE — Telephone Encounter (Signed)
Good Morning Dr. Ardis Hughs,  Patient called to cancel procedure tomorrow at 3:00 due to [facility] not covering her prep.

## 2021-10-01 ENCOUNTER — Encounter: Payer: Medicaid Other | Admitting: Gastroenterology

## 2021-12-06 ENCOUNTER — Other Ambulatory Visit: Payer: Self-pay | Admitting: Internal Medicine

## 2021-12-06 DIAGNOSIS — Z853 Personal history of malignant neoplasm of breast: Secondary | ICD-10-CM

## 2021-12-14 ENCOUNTER — Ambulatory Visit (AMBULATORY_SURGERY_CENTER): Payer: Medicaid Other

## 2021-12-14 ENCOUNTER — Other Ambulatory Visit: Payer: Self-pay

## 2021-12-14 VITALS — Ht 65.0 in | Wt 170.0 lb

## 2021-12-14 DIAGNOSIS — R194 Change in bowel habit: Secondary | ICD-10-CM

## 2021-12-14 DIAGNOSIS — R109 Unspecified abdominal pain: Secondary | ICD-10-CM

## 2021-12-14 DIAGNOSIS — Z86010 Personal history of colonic polyps: Secondary | ICD-10-CM

## 2021-12-14 NOTE — Progress Notes (Signed)

## 2021-12-17 ENCOUNTER — Encounter (HOSPITAL_COMMUNITY): Payer: Self-pay

## 2021-12-17 ENCOUNTER — Other Ambulatory Visit: Payer: Self-pay

## 2021-12-17 ENCOUNTER — Emergency Department (HOSPITAL_COMMUNITY)
Admission: EM | Admit: 2021-12-17 | Discharge: 2021-12-17 | Disposition: A | Discharge status: Home / Self Care | Payer: Medicaid Other | Attending: Emergency Medicine | Admitting: Emergency Medicine

## 2021-12-17 ENCOUNTER — Emergency Department (HOSPITAL_COMMUNITY)
Admission: EM | Admit: 2021-12-17 | Discharge: 2021-12-17 | Disposition: A | Discharge status: Home / Self Care | Payer: Medicaid Other | Source: Home / Self Care | Attending: Emergency Medicine | Admitting: Emergency Medicine

## 2021-12-17 DIAGNOSIS — Z8541 Personal history of malignant neoplasm of cervix uteri: Secondary | ICD-10-CM | POA: Insufficient documentation

## 2021-12-17 DIAGNOSIS — J45909 Unspecified asthma, uncomplicated: Secondary | ICD-10-CM | POA: Insufficient documentation

## 2021-12-17 DIAGNOSIS — Z853 Personal history of malignant neoplasm of breast: Secondary | ICD-10-CM | POA: Insufficient documentation

## 2021-12-17 DIAGNOSIS — T50905A Adverse effect of unspecified drugs, medicaments and biological substances, initial encounter: Secondary | ICD-10-CM

## 2021-12-17 DIAGNOSIS — Z79899 Other long term (current) drug therapy: Secondary | ICD-10-CM | POA: Insufficient documentation

## 2021-12-17 DIAGNOSIS — T43595A Adverse effect of other antipsychotics and neuroleptics, initial encounter: Secondary | ICD-10-CM | POA: Insufficient documentation

## 2021-12-17 DIAGNOSIS — R0789 Other chest pain: Secondary | ICD-10-CM | POA: Insufficient documentation

## 2021-12-17 DIAGNOSIS — R000 Tachycardia, unspecified: Secondary | ICD-10-CM | POA: Insufficient documentation

## 2021-12-17 NOTE — ED Triage Notes (Signed)
Pt states that her chest started hurting when she was unable to find a ride. ?

## 2021-12-17 NOTE — ED Provider Notes (Signed)
?[facility] DEPT ?Provider Note ? ? ?CSN: [id number] ?Arrival date & time: 12/17/21  6073 ? ?  ? ?History ? ?Chief Complaint  ?Patient presents with  ? Chest Pain  ? ? ?Markiesha Delia is a 56 y.o. female. ? ?The history is provided by the patient and medical records.  ?Chest Pain ?Maira Christon is a 56 y.o. female who presents to the Emergency Department complaining of medication reaction.  She has a history of bipolar disorder and has been off of her Seroquel for 5 months.  She was on 100 daily when she stopped taking it but had been started initially at 50 daily.  She started taking the medication again last night and a little while after taking the medication her mouth felt dry and she felt weird.  She called 911 and requested evaluation in the emergency department.  Overall she is feeling improved compared to earlier.  She was earlier assessed and discharged from the emergency department.  While she was waiting in the lobby for her ride she developed some chest discomfort described as a tight feeling.  This is in her central chest and spreads to her right axillary region.  Overall the symptoms are improving. ?No associated fever, cough, abdominal pain, nausea, vomiting, leg pain, difficulty breathing. ? 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  She does not take any hormone medications.  No history of DVT/PE.  No tobacco use.  She does have a history of asthma, seasonal allergies. ? ? ?  ? ?Home Medications ?Prior to Admission medications   ?Medication Sig Start Date End Date Taking? Authorizing Provider  ?amLODipine (NORVASC) 5 MG tablet Take 5 mg by mouth daily. 06/01/21   [provider]  ?Cetirizine HCl (ZYRTEC PO) Take by mouth.    [provider]  ?cyclobenzaprine (FLEXERIL) 5 MG tablet Take 5 mg by mouth 3 (three) times daily as needed. ?Patient not taking: Reported on 12/14/2021  08/05/21   [provider]  ?diphenhydrAMINE (BENADRYL) 25 MG tablet Take 25 mg by mouth at bedtime. ?Patient not taking: Reported on 12/14/2021    [provider]  ?fluticasone (FLONASE) 50 MCG/ACT nasal spray Place into both nostrils daily.    [provider]  ?QUEtiapine (SEROQUEL) 100 MG tablet Take 100 mg by mouth at bedtime. 04/27/21   [provider]  ?   ? ?Allergies    ?Aromasin [exemestane]   ? ?Review of Systems   ?Review of Systems  ?Cardiovascular:  Positive for chest pain.  ?All other systems reviewed and are negative. ? ?Physical Exam ?Updated Vital Signs ?BP 110/90 (BP Location: Left Arm)   Pulse (!) 106   Temp 97.6 ?F (36.4 ?C) (Oral)   Resp 18   SpO2 100%  ?Physical Exam ?Vitals and nursing note reviewed.  ?Constitutional:   ?   Appearance: She is well-developed.  ?HENT:  ?   Head: Normocephalic and atraumatic.  ?Cardiovascular:  ?   Rate and Rhythm: Normal rate and regular rhythm.  ?   Heart sounds: No murmur heard. ?Pulmonary:  ?   Effort: Pulmonary effort is normal. No respiratory distress.  ?   Breath sounds: Normal breath sounds.  ?Abdominal:  ?   Palpations: Abdomen is soft.  ?   Tenderness: There is no abdominal tenderness. There is no guarding or rebound.  ?Musculoskeletal:     ?   General: No swelling or  tenderness.  ?Skin: ?   General: Skin is warm and dry.  ?Neurological:  ?   Mental Status: She is alert and oriented to person, place, and time.  ?Psychiatric:     ?   Behavior: Behavior normal.  ? ? ?ED Results / Procedures / Treatments   ?Labs ?(all labs ordered are listed, but only abnormal results are displayed) ?Labs Reviewed - No data to display ? ?EKG ?EKG Interpretation ? ?Date/Time:  Friday December 17 2021 02:55:16 EST ?Ventricular Rate:  98 ?PR Interval:  142 ?QRS Duration: 80 ?QT Interval:  360 ?QTC Calculation: 460 ?R Axis:   58 ?Text Interpretation: Sinus rhythm Confirmed by Quintella Reichert [phone]) on 12/17/2021 3:04:55 AM ? ?Radiology ?No  results found. ? ?Procedures ?Procedures  ? ? ?Medications Ordered in ED ?Medications - No data to display ? ?ED Course/ Medical Decision Making/ A&P ?  ?                        ?Medical Decision Making ? ?Patient here for evaluation of chest discomfort in setting of restarting Seroquel, which she has been off for several months.  EKG is without acute ischemic changes.  She has no respiratory distress with good air movement bilaterally.  Current clinical picture is not consistent with CHF, ACS, PE.  Patient declines lab work-up at this time.  Her ride is available.  Her symptoms are improving.  Discussed that when she next takes her Seroquel and she should take the lower dose of 50 mg.  Plan to discharge with close return precautions if she has new or concerning symptoms. ? ? ? ? ? ? ?Final Clinical Impression(s) / ED Diagnoses ?Final diagnoses:  ?None  ? ? ?Rx / DC Orders ?ED Discharge Orders   ? ? None  ? ?  ? ? ?  ?Quintella Reichert, MD ?12/17/21 [phone] ? ?

## 2021-12-17 NOTE — ED Triage Notes (Signed)
Patient BIB [facility] from home. Stopped taking her Seroquel in October 2022. Took first dose of '100mg'$  tonight, PCP increased dose from '50mg'$  to '100mg'$ . Patient thinks her blood pressure is high, mouth dry.  ?

## 2021-12-17 NOTE — ED Notes (Signed)
First thing when RN walked into the room, patient asked "how am I gonna get home, do yall give cab vouchers." RN stated no. Patient then said "EMS said they gave me a ride here, they can give me one home." RN stated no, she does not meet the [facility] requirements. Patient angry, stating she has no friends in the area. Patient then reaches for cell phone saying she was going to leave.  ?

## 2021-12-17 NOTE — Discharge Instructions (Signed)
Decrease the quetiapine/Seroquel from 100 mg to 50 mg for the next 2 weeks.  Follow-up with your doctor as planned. ?

## 2021-12-17 NOTE — Discharge Instructions (Signed)
Get rechecked if you have new or concerning symptoms.   ?

## 2021-12-17 NOTE — ED Provider Notes (Signed)
?[facility] DEPT ?Provider Note ? ? ?CSN: [id number] ?Arrival date & time: 12/17/21  7672 ? ?  ? ?History ? ?Chief Complaint  ?Patient presents with  ? Medication Reaction  ? ? ?Veronica Conrad is a 56 y.o. female. ? ?Patient presents to the emergency department for evaluation of symptoms that started after taking Seroquel today.  Patient states that she was on this last year to help her sleep and help with anxiety.  She discontinued this last fall when she moved to [place] to help care for her mother who has breast cancer.  She states that she returned to the area and was started on Seroquel 100 mg by her PCP.  She took her first dose tonight around 10 PM.  Previously she was on 50 mg.  She states that after taking the medication she began to feel very odd, very sleepy and her mouth is very dry.  She states that the medicine seemed "very strong" compared to what she was used to with this in the past.  She called EMS to get her vitals check.  Patient claims that she was encouraged to come to the emergency department because of her blood pressure being elevated to 094 systolic.  She agreed to transport.  Blood pressure improved en route.  Currently her symptoms are resolved and she is feeling better.  She denies lightheadedness or shortness of breath.  She denies chest pain or syncope. ? ? ?  ? ?Home Medications ?Prior to Admission medications   ?Medication Sig Start Date End Date Taking? Authorizing Provider  ?amLODipine (NORVASC) 5 MG tablet Take 5 mg by mouth daily. 06/01/21   [provider]  ?Cetirizine HCl (ZYRTEC PO) Take by mouth.    [provider]  ?cyclobenzaprine (FLEXERIL) 5 MG tablet Take 5 mg by mouth 3 (three) times daily as needed. ?Patient not taking: Reported on 12/14/2021 08/05/21   [provider]  ?diphenhydrAMINE (BENADRYL) 25 MG tablet Take 25 mg by mouth at bedtime. ?Patient not taking: Reported on 12/14/2021    [provider]   ?fluticasone (FLONASE) 50 MCG/ACT nasal spray Place into both nostrils daily.    [provider]  ?QUEtiapine (SEROQUEL) 100 MG tablet Take 100 mg by mouth at bedtime. 04/27/21   [provider]  ?   ? ?Allergies    ?Aromasin [exemestane]   ? ?Review of Systems   ?Review of Systems ? ?Physical Exam ?Updated Vital Signs ?BP 110/84 (BP Location: Left Arm)   Pulse (!) 104   Temp 98 ?F (36.7 ?C) (Oral)   Resp 15   SpO2 100%  ?Physical Exam ?Vitals and nursing note reviewed.  ?Constitutional:   ?   Appearance: She is well-developed. She is not diaphoretic.  ?HENT:  ?   Head: Normocephalic and atraumatic.  ?   Mouth/Throat:  ?   Mouth: Mucous membranes are not dry.  ?Eyes:  ?   Conjunctiva/sclera: Conjunctivae normal.  ?Cardiovascular:  ?   Rate and Rhythm: Normal rate and regular rhythm.  ?   Pulses: No decreased pulses.     ?     Radial pulses are 2+ on the right side and 2+ on the left side.  ?   Heart sounds: Normal heart sounds, S1 normal and S2 normal. No murmur heard. ?Pulmonary:  ?   Effort: Pulmonary effort is normal. No respiratory distress.  ?   Breath sounds: No wheezing.  ?Chest:  ?   Chest wall: No tenderness.  ?  Abdominal:  ?   General: Bowel sounds are normal.  ?   Palpations: Abdomen is soft.  ?   Tenderness: There is no abdominal tenderness. There is no guarding or rebound.  ?Musculoskeletal:     ?   General: Normal range of motion.  ?   Cervical back: Normal range of motion and neck supple.  ?Skin: ?   General: Skin is warm and dry.  ?   Coloration: Skin is not pale.  ?Neurological:  ?   Mental Status: She is alert.  ? ? ?ED Results / Procedures / Treatments   ?Labs ?(all labs ordered are listed, but only abnormal results are displayed) ?Labs Reviewed - No data to display ? ?EKG ?None ? ?Radiology ?No results found. ? ?Procedures ?Procedures  ? ? ?Medications Ordered in ED ?Medications - No data to display ? ?ED Course/ Medical Decision Making/ A&P ?  ? ?Patient seen and examined.  History obtained directly from patient.  ? ?Most recent vital signs reviewed and are as follows: ?BP 110/84 (BP Location: Left Arm)   Pulse (!) 104   Temp 98 ?F (36.7 ?C) (Oral)   Resp 15   SpO2 100%  ? ?Initial impression: Medication intolerance ? ?Home treatment plan: Decrease dose to 50 mg at bedtime at least for 2 weeks before increasing dose. ? ?Return instructions discussed with patient: Recurrence of symptoms, chest pain, shortness of breath, passing out ? ?Follow-up instructions discussed with patient: Encourage PCP follow-up as planned. ? ?                        ?Medical Decision Making ? ?Patient with sedation and dry mouth after taking Seroquel for the first time in several months.  Suspect that this was related to her medication intake.  No signs of anaphylaxis.  She is not having any chest pain or shortness of breath, lightheadedness or syncope to suggest cardiac event.  Her symptoms are now resolved.  Vital signs reviewed and are unremarkable other than mild tachycardia.  As she is feeling better and likely cause related to medication, do not feel that work-up is indicated at this time.  Patient in agreement. ? ? ? ? ? ? ? ?Final Clinical Impression(s) / ED Diagnoses ?Final diagnoses:  ?Adverse effect of drug, initial encounter  ? ? ?Rx / DC Orders ?ED Discharge Orders   ? ? None  ? ?  ? ? ?  ?Carlisle Cater, PA-C ?12/17/21 0149 ? ?  ?Quintella Reichert, MD ?12/17/21 0221 ? ?

## 2021-12-21 ENCOUNTER — Other Ambulatory Visit: Payer: Self-pay

## 2021-12-21 ENCOUNTER — Other Ambulatory Visit: Payer: Self-pay | Admitting: Internal Medicine

## 2021-12-21 ENCOUNTER — Ambulatory Visit
Admission: RE | Admit: 2021-12-21 | Discharge: 2021-12-21 | Disposition: A | Discharge status: Home / Self Care | Payer: Medicaid Other | Source: Ambulatory Visit | Attending: Internal Medicine | Admitting: Internal Medicine

## 2021-12-21 DIAGNOSIS — R921 Mammographic calcification found on diagnostic imaging of breast: Secondary | ICD-10-CM

## 2021-12-21 DIAGNOSIS — Z853 Personal history of malignant neoplasm of breast: Secondary | ICD-10-CM

## 2021-12-28 ENCOUNTER — Ambulatory Visit (AMBULATORY_SURGERY_CENTER): Payer: Medicaid Other | Admitting: Gastroenterology

## 2021-12-28 ENCOUNTER — Other Ambulatory Visit: Payer: Self-pay | Admitting: Gastroenterology

## 2021-12-28 ENCOUNTER — Other Ambulatory Visit: Payer: Self-pay

## 2021-12-28 ENCOUNTER — Encounter: Payer: Self-pay | Admitting: Gastroenterology

## 2021-12-28 VITALS — BP 138/88 | HR 81 | Temp 96.9°F | Resp 13 | Ht 60.5 in | Wt 170.0 lb

## 2021-12-28 DIAGNOSIS — K635 Polyp of colon: Secondary | ICD-10-CM

## 2021-12-28 DIAGNOSIS — Z86010 Personal history of colonic polyps: Secondary | ICD-10-CM

## 2021-12-28 DIAGNOSIS — D124 Benign neoplasm of descending colon: Secondary | ICD-10-CM

## 2021-12-28 DIAGNOSIS — Z800 Family history of malignant neoplasm of digestive organs: Secondary | ICD-10-CM

## 2021-12-28 DIAGNOSIS — R194 Change in bowel habit: Secondary | ICD-10-CM | POA: Diagnosis not present

## 2021-12-28 DIAGNOSIS — R109 Unspecified abdominal pain: Secondary | ICD-10-CM | POA: Diagnosis not present

## 2021-12-28 MED ORDER — SODIUM CHLORIDE 0.9 % IV SOLN: 500.0000 mL | Freq: Once | INTRAVENOUS | Status: DC

## 2021-12-28 NOTE — Op Note (Signed)
[place] ?Patient Name: Veronica Conrad ?Procedure Date: 12/28/2021 10:45 AM ?MRN: [id number] ?Endoscopist: Milus Banister , MD ?Age: 56 ?Referring MD:  ?Date of Birth: Sep 22, 1966 ?Gender: Female ?Account #: [id number] ?Procedure:                Colonoscopy ?Indications:              Change in bowel habits, increased frequency. Also  ?                          unlce had colon cancer ?Medicines:                Monitored Anesthesia Care ?Procedure:                Pre-Anesthesia Assessment: ?                          - Prior to the procedure, a History and Physical  ?                          was performed, and patient medications and  ?                          allergies were reviewed. The patient's tolerance of  ?                          previous anesthesia was also reviewed. The risks  ?                          and benefits of the procedure and the sedation  ?                          options and risks were discussed with the patient.  ?                          All questions were answered, and informed consent  ?                          was obtained. Prior Anticoagulants: The patient has  ?                          taken no previous anticoagulant or antiplatelet  ?                          agents. ASA Grade Assessment: III - A patient with  ?                          severe systemic disease. After reviewing the risks  ?                          and benefits, the patient was deemed in  ?                          satisfactory condition to undergo the procedure. ?  After obtaining informed consent, the colonoscope  ?                          was passed under direct vision. Throughout the  ?                          procedure, the patient's blood pressure, pulse, and  ?                          oxygen saturations were monitored continuously. The  ?                          Olympus PCF-H190DL (#[id number]) Colonoscope was  ?                          introduced through the anus and  advanced to the the  ?                          terminal ileum. The colonoscopy was performed  ?                          without difficulty. The patient tolerated the  ?                          procedure well. The quality of the bowel  ?                          preparation was good. The terminal ileum, ileocecal  ?                          valve, appendiceal orifice, and rectum were  ?                          photographed. ?Scope In: 11:00:01 AM ?Scope Out: 11:10:41 AM ?Scope Withdrawal Time: 0 hours 8 minutes 6 seconds  ?Total Procedure Duration: 0 hours 10 minutes 40 seconds  ?Findings:                 The terminal ileum appeared normal. ?                          A 2 mm polyp was found in the descending colon. The  ?                          polyp was sessile. The polyp was removed with a  ?                          cold snare. Resection and retrieval were complete. ?                          Biopsies for histology were taken with a cold  ?                          forceps from the entire colon for evaluation of  ?  microscopic colitis. ?                          The exam was otherwise without abnormality on  ?                          direct and retroflexion views. ?Complications:            No immediate complications. Estimated blood loss:  ?                          None. ?Estimated Blood Loss:     Estimated blood loss: none. ?Impression:               - The examined portion of the ileum was normal. ?                          - One 2 mm polyp in the descending colon, removed  ?                          with a cold snare. Resected and retrieved. ?                          - The examination was otherwise normal on direct  ?                          and retroflexion views. ?                          - Biopsies were taken with a cold forceps from the  ?                          entire colon for evaluation of microscopic colitis. ?Recommendation:           - Patient has a contact number  available for  ?                          emergencies. The signs and symptoms of potential  ?                          delayed complications were discussed with the  ?                          patient. Return to normal activities tomorrow.  ?                          Written discharge instructions were provided to the  ?                          patient. ?                          - Resume previous diet. ?                          - Continue present medications. ?                          -  Await pathology results. ?Milus Banister, MD ?12/28/2021 11:12:55 AM ?This report has been signed electronically. ?

## 2021-12-28 NOTE — Progress Notes (Signed)
VS by CW  Pt's states no medical or surgical changes since previsit or office visit.  

## 2021-12-28 NOTE — Progress Notes (Signed)
HPI: ?This is a woman with change in bowel habits ? ? ?ROS: complete GI ROS as described in HPI, all other review negative. ? ?Constitutional:  No unintentional weight loss ? ? ?Past Medical History:  ?Diagnosis Date  ? Allergy   ? Anemia   ? Anxiety   ? Arthritis   ? Asthma   ? 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 Cervical cancer ([facility]) 2015  ? Family history of colon cancer   ? HTN (hypertension)   ? ? ?Past Surgical History:  ?Procedure Laterality Date  ? ABDOMINAL HYSTERECTOMY    ? BREAST BIOPSY Left 02/14/2020  ? BREAST BIOPSY Left 03/16/2020  ? BREAST LUMPECTOMY Left 04/10/2020  ? BREAST LUMPECTOMY WITH RADIOACTIVE SEED LOCALIZATION Left 04/10/2020  ? Procedure: LEFT BREAST LUMPECTOMY WITH RADIOACTIVE SEED LOCALIZATION;  Surgeon: Alphonsa Overall, MD;  Location: [facility];  Service: General;  Laterality: Left;  ? ? ?Current Outpatient Medications  ?Medication Sig Dispense Refill  ? amLODipine (NORVASC) 5 MG tablet Take 5 mg by mouth daily.    ? Cetirizine HCl (ZYRTEC PO) Take by mouth.    ? fluticasone (FLONASE) 50 MCG/ACT nasal spray Place into both nostrils daily.    ? QUEtiapine (SEROQUEL) 100 MG tablet Take 100 mg by mouth at bedtime.    ? cyclobenzaprine (FLEXERIL) 5 MG tablet Take 5 mg by mouth 3 (three) times daily as needed. (Patient not taking: Reported on 12/14/2021)    ? diphenhydrAMINE (BENADRYL) 25 MG tablet Take 25 mg by mouth at bedtime. (Patient not taking: Reported on 12/14/2021)    ? ?Current Facility-Administered Medications  ?Medication Dose Route Frequency Provider Last Rate Last Admin  ? 0.9 %  sodium chloride infusion  500 mL Intravenous Once Milus Banister, MD      ? ? ?Allergies as of 12/28/2021 - Review Complete 12/28/2021  ?Allergen Reaction Noted  ? Aromasin [exemestane] Rash 08/18/2021  ? ? ?Family History  ?Problem Relation Age of Onset  ? Breast cancer Mother 69  ? Hypertension Mother   ? Heart disease Mother   ? High Cholesterol Mother   ? Birth defects Mother   ?  Other Mother   ?     adopted  ? Heart Problems Father   ?     pacemaker  ? Colon cancer Paternal Uncle   ?     dx. in his 66s  ? Diabetes Maternal Grandfather   ? Colon polyps Neg Hx   ? Esophageal cancer Neg Hx   ? Stomach cancer Neg Hx   ? Rectal cancer Neg Hx   ? ? ?Social History  ? ?Socioeconomic History  ? Marital status: Single  ?  Spouse name: Not on file  ? Number of children: 3  ? Years of education: Not on file  ? Highest education level: 12th grade  ?Occupational History  ? Occupation: Customer service  ?Tobacco Use  ? Smoking status: Every Day  ?  Types: Cigarettes  ? Smokeless tobacco: Never  ?Vaping Use  ? Vaping Use: Never used  ?Substance and Sexual Activity  ? Alcohol use: Yes  ?  Comment: 2 wine per day  ? Drug use: Never  ? Sexual activity: Not Currently  ?  Birth control/protection: Surgical  ?Other Topics Concern  ? Not on file  ?Social History Narrative  ? Not on file  ? ?Social Determinants of Health  ? ?Financial Resource Strain: Not on file  ?Food Insecurity: Not on  file  ?Transportation Needs: Not on file  ?Physical Activity: Not on file  ?Stress: Not on file  ?Social Connections: Not on file  ?Intimate Partner Violence: Not on file  ? ? ? ?Physical Exam: ?BP (!) 124/93   Pulse (!) 106   Temp (!) 96.9 ?F (36.1 ?C) (Skin)   Ht 5' 0.5" (1.537 m)   Wt 170 lb (77.1 kg)   SpO2 99%   BMI 32.65 kg/m?  ?Constitutional: generally well-appearing ?Psychiatric: alert and oriented x3 ?Lungs: CTA bilaterally ?Heart: no MCR ? ?Assessment and plan: ?56 y.o. female with a change in bowel habits, left sided flank pain ? ?Colonoscoy today ? ?Care is appropriate for the ambulatory setting. ? ?Owens Loffler, MD ?[facility] ?12/28/2021, 10:49 AM ? ? ? ?

## 2021-12-28 NOTE — Progress Notes (Signed)
Report given to PACU, vss 

## 2021-12-28 NOTE — Progress Notes (Signed)
Called to room to assist during endoscopic procedure.  Patient ID and intended procedure confirmed with present staff. Received instructions for my participation in the procedure from the performing physician.  

## 2021-12-28 NOTE — Patient Instructions (Signed)
Handout provided on polyps.   YOU HAD AN ENDOSCOPIC PROCEDURE TODAY AT THE [facility]:   Refer to the procedure report that was given to you for any specific questions about what was found during the examination.  If the procedure report does not answer your questions, please call your gastroenterologist to clarify.  If you requested that your care partner not be given the details of your procedure findings, then the procedure report has been included in a sealed envelope for you to review at your convenience later.  YOU SHOULD EXPECT: Some feelings of bloating in the abdomen. Passage of more gas than usual.  Walking can help get rid of the air that was put into your GI tract during the procedure and reduce the bloating. If you had a lower endoscopy (such as a colonoscopy or flexible sigmoidoscopy) you may notice spotting of blood in your stool or on the toilet paper. If you underwent a bowel prep for your procedure, you may not have a normal bowel movement for a few days.  Please Note:  You might notice some irritation and congestion in your nose or some drainage.  This is from the oxygen used during your procedure.  There is no need for concern and it should clear up in a day or so.  SYMPTOMS TO REPORT IMMEDIATELY:  Following lower endoscopy (colonoscopy or flexible sigmoidoscopy):  Excessive amounts of blood in the stool  Significant tenderness or worsening of abdominal pains  Swelling of the abdomen that is new, acute  Fever of 100F or higher  For urgent or emergent issues, a gastroenterologist can be reached at any hour by calling [phone]. Do not use MyChart messaging for urgent concerns.    DIET:  We do recommend a small meal at first, but then you may proceed to your regular diet.  Drink plenty of fluids but you should avoid alcoholic beverages for 24 hours.  ACTIVITY:  You should plan to take it easy for the rest of today and you should NOT DRIVE or use heavy  machinery until tomorrow (because of the sedation medicines used during the test).    FOLLOW UP: Our staff will call the number listed on your records 48-72 hours following your procedure to check on you and address any questions or concerns that you may have regarding the information given to you following your procedure. If we do not reach you, we will leave a message.  We will attempt to reach you two times.  During this call, we will ask if you have developed any symptoms of COVID 19. If you develop any symptoms (ie: fever, flu-like symptoms, shortness of breath, cough etc.) before then, please call [phone].  If you test positive for Covid 19 in the 2 weeks post procedure, please call and report this information to us.    If any biopsies were taken you will be contacted by phone or by letter within the next 1-3 weeks.  Please call us at [phone] if you have not heard about the biopsies in 3 weeks.    SIGNATURES/CONFIDENTIALITY: You and/or your care partner have signed paperwork which will be entered into your electronic medical record.  These signatures attest to the fact that that the information above on your After Visit Summary has been reviewed and is understood.  Full responsibility of the confidentiality of this discharge information lies with you and/or your care-partner.  

## 2021-12-30 ENCOUNTER — Telehealth: Payer: Self-pay

## 2021-12-30 NOTE — Telephone Encounter (Signed)
?  Follow up Call- ? ? ?  12/28/2021  ? 10:40 AM  ?Call back number  ?Post procedure Call Back phone  # [phone]  ?Permission to leave phone message Yes  ?  ? ?Patient questions: ? ?Do you have a fever, pain , or abdominal swelling? No. ?Pain Score  0 * ? ?Have you tolerated food without any problems? Yes.   ? ?Have you been able to return to your normal activities? Yes.   ? ?Do you have any questions about your discharge instructions: ?Diet   No. ?Medications  No. ?Follow up visit  No. ? ?Do you have questions or concerns about your Care? No. ? ?Actions: ?* If pain score is 4 or above: ?No action needed, pain <4. ? ? ?

## 2021-12-31 ENCOUNTER — Ambulatory Visit
Admission: RE | Admit: 2021-12-31 | Discharge: 2021-12-31 | Disposition: A | Discharge status: Home / Self Care | Payer: Medicaid Other | Source: Ambulatory Visit | Attending: Internal Medicine | Admitting: Internal Medicine

## 2021-12-31 DIAGNOSIS — Z853 Personal history of malignant neoplasm of breast: Secondary | ICD-10-CM

## 2021-12-31 DIAGNOSIS — R921 Mammographic calcification found on diagnostic imaging of breast: Secondary | ICD-10-CM

## 2022-01-03 ENCOUNTER — Encounter: Payer: Self-pay | Admitting: Gastroenterology

## 2022-05-13 ENCOUNTER — Ambulatory Visit: Payer: Self-pay | Admitting: General Surgery

## 2022-05-13 DIAGNOSIS — N6092 Unspecified benign mammary dysplasia of left breast: Secondary | ICD-10-CM

## 2022-05-16 ENCOUNTER — Other Ambulatory Visit: Payer: Self-pay | Admitting: General Surgery

## 2022-05-16 DIAGNOSIS — N6092 Unspecified benign mammary dysplasia of left breast: Secondary | ICD-10-CM

## 2022-05-23 ENCOUNTER — Telehealth: Payer: Self-pay | Admitting: Hematology and Oncology

## 2022-05-23 NOTE — Telephone Encounter (Signed)
Scheduled appt per 8/11 referral. Pt is aware of appt date and time. Pt is aware to arrive 15 mins prior to appt time and to bring and updated insurance card. Pt is aware of appt location.   

## 2022-06-03 ENCOUNTER — Encounter (HOSPITAL_BASED_OUTPATIENT_CLINIC_OR_DEPARTMENT_OTHER): Payer: Self-pay | Admitting: General Surgery

## 2022-06-05 ENCOUNTER — Emergency Department (HOSPITAL_COMMUNITY)
Admission: EM | Admit: 2022-06-05 | Discharge: 2022-06-05 | Discharge status: Against Medical Advice | Payer: Self-pay | Attending: Emergency Medicine | Admitting: Emergency Medicine

## 2022-06-05 ENCOUNTER — Other Ambulatory Visit: Payer: Self-pay

## 2022-06-05 ENCOUNTER — Encounter (HOSPITAL_COMMUNITY): Payer: Self-pay

## 2022-06-05 DIAGNOSIS — J45909 Unspecified asthma, uncomplicated: Secondary | ICD-10-CM | POA: Insufficient documentation

## 2022-06-05 DIAGNOSIS — Z5321 Procedure and treatment not carried out due to patient leaving prior to being seen by health care provider: Secondary | ICD-10-CM | POA: Insufficient documentation

## 2022-06-05 DIAGNOSIS — U071 COVID-19: Secondary | ICD-10-CM | POA: Insufficient documentation

## 2022-06-05 LAB — RESP PANEL BY RT-PCR (FLU A&B, COVID) ARPGX2
Influenza A by PCR: NEGATIVE
Influenza B by PCR: NEGATIVE
SARS Coronavirus 2 by RT PCR: POSITIVE — AB

## 2022-06-05 NOTE — ED Notes (Signed)
Pt sts she is leaving 

## 2022-06-05 NOTE — ED Triage Notes (Signed)
Ambulatory to the ED with c/o shortness of breath. States she was dx with a sinus infection on Thursday, started on amoxicillin then tonight felt like she just couldn't catch her breath when she was laying down. Hx of asthma. Has no home nebs or inhalers.

## 2022-06-06 NOTE — Progress Notes (Signed)
Pt had positive Covid test on in the ED on 06/05/22. I have spoken with Abigail Butts at Dr.Toth's office and she is aware that this pt will need to be rescheduled for 10 days after positive test results.

## 2022-06-10 DIAGNOSIS — Z01818 Encounter for other preprocedural examination: Secondary | ICD-10-CM

## 2022-06-20 ENCOUNTER — Inpatient Hospital Stay: Payer: Self-pay | Attending: Hematology and Oncology | Admitting: Hematology and Oncology

## 2022-06-20 NOTE — Assessment & Plan Note (Deleted)
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  Return to clinic after surgery to discuss the final pathology report.

## 2022-06-24 ENCOUNTER — Telehealth: Payer: Self-pay | Admitting: Hematology and Oncology

## 2022-06-24 ENCOUNTER — Ambulatory Visit (HOSPITAL_BASED_OUTPATIENT_CLINIC_OR_DEPARTMENT_OTHER): Admission: RE | Admit: 2022-06-24 | Payer: Medicaid Other | Source: Home / Self Care | Admitting: General Surgery

## 2022-06-24 DIAGNOSIS — Z01818 Encounter for other preprocedural examination: Secondary | ICD-10-CM

## 2022-06-24 SURGERY — BREAST LUMPECTOMY WITH RADIOACTIVE SEED LOCALIZATION
Anesthesia: General | Site: Breast | Laterality: Left

## 2022-06-24 NOTE — Telephone Encounter (Signed)
Pt no showed appt. Spoke to pt who said she had to cancel her surgery due to Covid and once that is r/s she will call us back to r/s her new pt appt.

## 2022-07-19 ENCOUNTER — Ambulatory Visit: Payer: Self-pay | Admitting: Family Medicine

## 2022-08-11 ENCOUNTER — Encounter (HOSPITAL_BASED_OUTPATIENT_CLINIC_OR_DEPARTMENT_OTHER): Payer: Self-pay | Admitting: General Surgery

## 2022-08-11 ENCOUNTER — Other Ambulatory Visit: Payer: Self-pay

## 2022-08-18 ENCOUNTER — Ambulatory Visit
Admission: RE | Admit: 2022-08-18 | Discharge: 2022-08-18 | Disposition: A | Discharge status: Home / Self Care | Payer: 59 | Source: Ambulatory Visit | Attending: General Surgery | Admitting: General Surgery

## 2022-08-18 DIAGNOSIS — N6092 Unspecified benign mammary dysplasia of left breast: Secondary | ICD-10-CM

## 2022-08-18 HISTORY — PX: BREAST BIOPSY: SHX20

## 2022-08-18 NOTE — Progress Notes (Signed)
Surgical soap given with instructions, pt verbalized understanding.Enhanced Recovery after Surgery  Enhanced Recovery after Surgery is a protocol used to improve the stress on your body and your recovery after surgery.  Patient Instructions  The night before surgery:  No food after midnight. ONLY clear liquids after midnight  The day of surgery (if you do NOT have diabetes):  Drink ONE (1) Pre-Surgery Clear Ensure as directed.   This drink was given to you during your hospital  pre-op appointment visit. The pre-op nurse will instruct you on the time to drink the  Pre-Surgery Ensure depending on your surgery time. Finish the drink at the designated time by the pre-op nurse.  Nothing else to drink after completing the  Pre-Surgery Clear Ensure.  The day of surgery (if you have diabetes): Drink ONE (1) Gatorade 2 (G2) as directed. This drink was given to you during your hospital  pre-op appointment visit.  The pre-op nurse will instruct you on the time to drink the   Gatorade 2 (G2) depending on your surgery time. Color of the Gatorade may vary. Red is not allowed. Nothing else to drink after completing the  Gatorade 2 (G2).         If office.you have questions, please contact your surgeon's officeEnhanced Recovery after Surgery  Enhanced Recovery after Surgery is a protocol used to improve the stress on your body and your recovery after surgery.  Patient Instructions  The night before surgery:  No food after midnight. ONLY clear liquids after midnight  The day of surgery (if you do NOT have diabetes):  Drink ONE (1) Pre-Surgery Clear Ensure as directed.   This drink was given to you during your hospital  pre-op appointment visit. The pre-op nurse will instruct you on the time to drink the  Pre-Surgery Ensure depending on your surgery time. Finish the drink at the designated time by the pre-op nurse.  Nothing else to drink after completing the  Pre-Surgery Clear  Ensure.  The day of surgery (if you have diabetes): Drink ONE (1) Gatorade 2 (G2) as directed. This drink was given to you during your hospital  pre-op appointment visit.  The pre-op nurse will instruct you on the time to drink the   Gatorade 2 (G2) depending on your surgery time. Color of the Gatorade may vary. Red is not allowed. Nothing else to drink after completing the  Gatorade 2 (G2).         If office.you have questions, please contact your surgeon's office

## 2022-08-19 ENCOUNTER — Ambulatory Visit
Admission: RE | Admit: 2022-08-19 | Discharge: 2022-08-19 | Disposition: A | Discharge status: Home / Self Care | Payer: Self-pay | Source: Ambulatory Visit | Attending: General Surgery | Admitting: General Surgery

## 2022-08-19 ENCOUNTER — Ambulatory Visit (HOSPITAL_BASED_OUTPATIENT_CLINIC_OR_DEPARTMENT_OTHER)
Admission: RE | Admit: 2022-08-19 | Discharge: 2022-08-19 | Disposition: A | Discharge status: Home / Self Care | Payer: 59 | Attending: General Surgery | Admitting: General Surgery

## 2022-08-19 ENCOUNTER — Encounter (HOSPITAL_BASED_OUTPATIENT_CLINIC_OR_DEPARTMENT_OTHER)
Admission: RE | Disposition: A | Discharge status: Home / Self Care | Payer: Self-pay | Source: Home / Self Care | Attending: General Surgery

## 2022-08-19 ENCOUNTER — Other Ambulatory Visit: Payer: Self-pay

## 2022-08-19 ENCOUNTER — Ambulatory Visit (HOSPITAL_BASED_OUTPATIENT_CLINIC_OR_DEPARTMENT_OTHER): Payer: 59 | Admitting: Anesthesiology

## 2022-08-19 ENCOUNTER — Encounter (HOSPITAL_BASED_OUTPATIENT_CLINIC_OR_DEPARTMENT_OTHER): Payer: Self-pay | Admitting: General Surgery

## 2022-08-19 DIAGNOSIS — Z87891 Personal history of nicotine dependence: Secondary | ICD-10-CM | POA: Diagnosis not present

## 2022-08-19 DIAGNOSIS — N6092 Unspecified benign mammary dysplasia of left breast: Secondary | ICD-10-CM | POA: Insufficient documentation

## 2022-08-19 DIAGNOSIS — D0512 Intraductal carcinoma in situ of left breast: Secondary | ICD-10-CM

## 2022-08-19 DIAGNOSIS — I10 Essential (primary) hypertension: Secondary | ICD-10-CM

## 2022-08-19 DIAGNOSIS — Z01818 Encounter for other preprocedural examination: Secondary | ICD-10-CM

## 2022-08-19 DIAGNOSIS — J45909 Unspecified asthma, uncomplicated: Secondary | ICD-10-CM | POA: Diagnosis not present

## 2022-08-19 DIAGNOSIS — D0502 Lobular carcinoma in situ of left breast: Secondary | ICD-10-CM | POA: Diagnosis not present

## 2022-08-19 DIAGNOSIS — Z79899 Other long term (current) drug therapy: Secondary | ICD-10-CM | POA: Diagnosis not present

## 2022-08-19 HISTORY — PX: BREAST LUMPECTOMY WITH RADIOACTIVE SEED LOCALIZATION: SHX6424

## 2022-08-19 SURGERY — BREAST LUMPECTOMY WITH RADIOACTIVE SEED LOCALIZATION
Anesthesia: General | Site: Breast | Laterality: Left

## 2022-08-19 MED ORDER — OXYCODONE HCL 5 MG PO TABS: 5.0000 mg | ORAL_TABLET | Freq: Once | ORAL | Status: DC | PRN

## 2022-08-19 MED ORDER — ALBUTEROL SULFATE HFA 108 (90 BASE) MCG/ACT IN AERS
INHALATION_SPRAY | RESPIRATORY_TRACT | Status: AC
  Filled 2022-08-19: qty 6.7

## 2022-08-19 MED ORDER — CHLORHEXIDINE GLUCONATE CLOTH 2 % EX PADS: 6.0000 | MEDICATED_PAD | Freq: Once | CUTANEOUS | Status: DC

## 2022-08-19 MED ORDER — PROPOFOL 10 MG/ML IV BOLUS
INTRAVENOUS | Status: DC | PRN
  Administered 2022-08-19: 150 mg via INTRAVENOUS
  Administered 2022-08-19: 50 mg via INTRAVENOUS

## 2022-08-19 MED ORDER — DEXAMETHASONE SODIUM PHOSPHATE 10 MG/ML IJ SOLN
INTRAMUSCULAR | Status: DC | PRN
  Administered 2022-08-19: 5 mg via INTRAVENOUS

## 2022-08-19 MED ORDER — GABAPENTIN 300 MG PO CAPS
ORAL_CAPSULE | ORAL | Status: AC
  Filled 2022-08-19: qty 1

## 2022-08-19 MED ORDER — MIDAZOLAM HCL 2 MG/2ML IJ SOLN
INTRAMUSCULAR | Status: AC
  Filled 2022-08-19: qty 2

## 2022-08-19 MED ORDER — LIDOCAINE HCL (CARDIAC) PF 100 MG/5ML IV SOSY
PREFILLED_SYRINGE | INTRAVENOUS | Status: DC | PRN
  Administered 2022-08-19: 60 mg via INTRATRACHEAL

## 2022-08-19 MED ORDER — BUPIVACAINE-EPINEPHRINE (PF) 0.25% -1:200000 IJ SOLN
INTRAMUSCULAR | Status: DC | PRN
  Administered 2022-08-19: 20 mL

## 2022-08-19 MED ORDER — ACETAMINOPHEN 500 MG PO TABS
1000.0000 mg | ORAL_TABLET | ORAL | Status: AC
  Administered 2022-08-19: 1000 mg via ORAL

## 2022-08-19 MED ORDER — CEFAZOLIN SODIUM-DEXTROSE 2-4 GM/100ML-% IV SOLN
INTRAVENOUS | Status: AC
  Filled 2022-08-19: qty 100

## 2022-08-19 MED ORDER — MIDAZOLAM HCL 5 MG/5ML IJ SOLN
INTRAMUSCULAR | Status: DC | PRN
  Administered 2022-08-19: 2 mg via INTRAVENOUS

## 2022-08-19 MED ORDER — CEFAZOLIN SODIUM-DEXTROSE 2-4 GM/100ML-% IV SOLN
2.0000 g | INTRAVENOUS | Status: AC
  Administered 2022-08-19: 2 g via INTRAVENOUS

## 2022-08-19 MED ORDER — ALBUTEROL SULFATE HFA 108 (90 BASE) MCG/ACT IN AERS
INHALATION_SPRAY | RESPIRATORY_TRACT | Status: DC | PRN
  Administered 2022-08-19: 2 via RESPIRATORY_TRACT

## 2022-08-19 MED ORDER — ONDANSETRON HCL 4 MG/2ML IJ SOLN
INTRAMUSCULAR | Status: AC
  Filled 2022-08-19: qty 2

## 2022-08-19 MED ORDER — ONDANSETRON HCL 4 MG/2ML IJ SOLN
INTRAMUSCULAR | Status: DC | PRN
  Administered 2022-08-19: 4 mg via INTRAVENOUS

## 2022-08-19 MED ORDER — AMISULPRIDE (ANTIEMETIC) 5 MG/2ML IV SOLN: 10.0000 mg | Freq: Once | INTRAVENOUS | Status: DC | PRN

## 2022-08-19 MED ORDER — ACETAMINOPHEN 500 MG PO TABS
ORAL_TABLET | ORAL | Status: AC
  Filled 2022-08-19: qty 2

## 2022-08-19 MED ORDER — KETOROLAC TROMETHAMINE 30 MG/ML IJ SOLN: 30.0000 mg | Freq: Once | INTRAMUSCULAR | Status: DC | PRN

## 2022-08-19 MED ORDER — CELECOXIB 200 MG PO CAPS
200.0000 mg | ORAL_CAPSULE | ORAL | Status: AC
  Administered 2022-08-19: 200 mg via ORAL

## 2022-08-19 MED ORDER — GABAPENTIN 300 MG PO CAPS
300.0000 mg | ORAL_CAPSULE | ORAL | Status: AC
  Administered 2022-08-19: 300 mg via ORAL

## 2022-08-19 MED ORDER — FENTANYL CITRATE (PF) 100 MCG/2ML IJ SOLN
INTRAMUSCULAR | Status: DC | PRN
  Administered 2022-08-19: 50 ug via INTRAVENOUS

## 2022-08-19 MED ORDER — FENTANYL CITRATE (PF) 100 MCG/2ML IJ SOLN
INTRAMUSCULAR | Status: AC
  Filled 2022-08-19: qty 2

## 2022-08-19 MED ORDER — PHENYLEPHRINE HCL (PRESSORS) 10 MG/ML IV SOLN
INTRAVENOUS | Status: DC | PRN
  Administered 2022-08-19 (×2): 80 ug via INTRAVENOUS

## 2022-08-19 MED ORDER — OXYCODONE HCL 5 MG/5ML PO SOLN: 5.0000 mg | Freq: Once | ORAL | Status: DC | PRN

## 2022-08-19 MED ORDER — OXYCODONE HCL 5 MG PO TABS: 5.0000 mg | ORAL_TABLET | Freq: Four times a day (QID) | ORAL | 0 refills | Status: DC | PRN

## 2022-08-19 MED ORDER — ONDANSETRON HCL 4 MG/2ML IJ SOLN: 4.0000 mg | Freq: Once | INTRAMUSCULAR | Status: DC | PRN

## 2022-08-19 MED ORDER — LACTATED RINGERS IV SOLN
INTRAVENOUS | Status: DC
Start: 2022-08-19 — End: 2022-08-19

## 2022-08-19 MED ORDER — SUCCINYLCHOLINE CHLORIDE 200 MG/10ML IV SOSY
PREFILLED_SYRINGE | INTRAVENOUS | Status: DC | PRN
  Administered 2022-08-19: 140 mg via INTRAVENOUS

## 2022-08-19 MED ORDER — BUPIVACAINE-EPINEPHRINE (PF) 0.25% -1:200000 IJ SOLN
INTRAMUSCULAR | Status: AC
  Filled 2022-08-19: qty 120

## 2022-08-19 MED ORDER — PROPOFOL 10 MG/ML IV BOLUS
INTRAVENOUS | Status: AC
  Filled 2022-08-19: qty 20

## 2022-08-19 MED ORDER — ACETAMINOPHEN 500 MG PO TABS: 1000.0000 mg | ORAL_TABLET | Freq: Once | ORAL | Status: AC

## 2022-08-19 MED ORDER — HYDROMORPHONE HCL 1 MG/ML IJ SOLN: 0.2500 mg | INTRAMUSCULAR | Status: DC | PRN

## 2022-08-19 MED ORDER — CELECOXIB 200 MG PO CAPS
ORAL_CAPSULE | ORAL | Status: AC
  Filled 2022-08-19: qty 1

## 2022-08-19 SURGICAL SUPPLY — 40 items
ADH SKN CLS APL DERMABOND .7 (GAUZE/BANDAGES/DRESSINGS) ×1
APL PRP STRL LF DISP 70% ISPRP (MISCELLANEOUS) ×1
APPLIER CLIP 9.375 MED OPEN (MISCELLANEOUS)
APR CLP MED 9.3 20 MLT OPN (MISCELLANEOUS)
BLADE SURG 15 STRL LF DISP TIS (BLADE) ×1 IMPLANT
BLADE SURG 15 STRL SS (BLADE) ×1
CANISTER SUC SOCK COL 7IN (MISCELLANEOUS) ×1 IMPLANT
CANISTER SUCT 1200ML W/VALVE (MISCELLANEOUS) ×1 IMPLANT
CHLORAPREP W/TINT 26 (MISCELLANEOUS) ×1 IMPLANT
CLIP APPLIE 9.375 MED OPEN (MISCELLANEOUS) IMPLANT
COVER BACK TABLE 60X90IN (DRAPES) ×1 IMPLANT
COVER MAYO STAND STRL (DRAPES) ×1 IMPLANT
COVER PROBE W GEL 5X96 (DRAPES) ×1 IMPLANT
DERMABOND ADVANCED .7 DNX12 (GAUZE/BANDAGES/DRESSINGS) ×1 IMPLANT
DRAPE LAPAROSCOPIC ABDOMINAL (DRAPES) ×1 IMPLANT
DRAPE UTILITY XL STRL (DRAPES) ×1 IMPLANT
ELECT COATED BLADE 2.86 ST (ELECTRODE) ×1 IMPLANT
ELECT REM PT RETURN 9FT ADLT (ELECTROSURGICAL) ×1
ELECTRODE REM PT RTRN 9FT ADLT (ELECTROSURGICAL) ×1 IMPLANT
GLOVE BIO SURGEON STRL SZ 6.5 (GLOVE) IMPLANT
GLOVE BIO SURGEON STRL SZ7.5 (GLOVE) ×2 IMPLANT
GLOVE BIOGEL PI IND STRL 7.0 (GLOVE) IMPLANT
GOWN STRL REUS W/ TWL LRG LVL3 (GOWN DISPOSABLE) ×2 IMPLANT
GOWN STRL REUS W/TWL LRG LVL3 (GOWN DISPOSABLE) ×2
KIT MARKER MARGIN INK (KITS) ×1 IMPLANT
NDL HYPO 25X1 1.5 SAFETY (NEEDLE) IMPLANT
NEEDLE HYPO 25X1 1.5 SAFETY (NEEDLE) ×1 IMPLANT
NS IRRIG 1000ML POUR BTL (IV SOLUTION) IMPLANT
PACK BASIN DAY SURGERY FS (CUSTOM PROCEDURE TRAY) ×1 IMPLANT
PENCIL SMOKE EVACUATOR (MISCELLANEOUS) ×1 IMPLANT
SLEEVE SCD COMPRESS KNEE MED (STOCKING) ×1 IMPLANT
SPONGE T-LAP 18X18 ~~LOC~~+RFID (SPONGE) ×1 IMPLANT
SUT MON AB 4-0 PC3 18 (SUTURE) ×1 IMPLANT
SUT SILK 2 0 SH (SUTURE) IMPLANT
SUT VICRYL 3-0 CR8 SH (SUTURE) ×1 IMPLANT
SYR CONTROL 10ML LL (SYRINGE) IMPLANT
TOWEL GREEN STERILE FF (TOWEL DISPOSABLE) ×1 IMPLANT
TRAY FAXITRON CT DISP (TRAY / TRAY PROCEDURE) ×1 IMPLANT
TUBE CONNECTING 20X1/4 (TUBING) ×1 IMPLANT
YANKAUER SUCT BULB TIP NO VENT (SUCTIONS) IMPLANT

## 2022-08-19 NOTE — H&P (Signed)
MRN: [id number] DOB: 1965-10-24 Subjective   Chief Complaint: New Consultation (Left atypical lobular hyperplasia )   History of Present Illness: Veronica Conrad is a 56 y.o. female who is seen today for atypical lobular hyperplasia. 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 She declined radiation. She could not tolerate the antiestrogen pill that they put her on so she did not take any. She has been fairly well since then. On her recent mammogram she was noted to have a new 4 mm area of calcification at her old lumpectomy site in the upper outer quadrant of the left breast. These were biopsied and came back as atypical lobular hyperplasia.    Review of Systems: A complete review of systems was obtained from the patient. I have reviewed this information and discussed as appropriate with the patient. See HPI as well for other ROS.  ROS   Medical History: Past Medical History:  Diagnosis Date  Anemia  Anxiety  Arthritis  Asthma, unspecified asthma severity, unspecified whether complicated, unspecified whether persistent  GERD (gastroesophageal reflux disease)  History of cancer  Hypertension   Patient Active Problem List  Diagnosis  Atypical lobular hyperplasia of left breast   History reviewed. No pertinent surgical history.   Allergies  Allergen Reactions  Exemestane Rash   Current Outpatient Medications on File Prior to Visit  Medication Sig Dispense Refill  amLODIPine (NORVASC) 5 MG tablet Take 5 mg by mouth once daily   No current facility-administered medications on file prior to visit.   History reviewed. No pertinent family history.   Social History   Tobacco Use  Smoking Status Former  Types: Cigarettes  Quit date: 11/2021  Years since quitting: 0.5  Smokeless Tobacco Never    Social History   Socioeconomic History  Marital status: Single   Tobacco Use  Smoking status: Former  Types: Cigarettes  Quit date: 11/2021  Years since quitting: 0.5  Smokeless tobacco: Never  Substance and Sexual Activity  Alcohol use: Yes  Drug use: Never   Objective:   Vitals:  BP: (!) 140/80  Pulse: 101  Temp: 36.7 C (98.1 F)  SpO2: 97%  Weight: 89.8 kg (198 lb)  Height: 154.9 cm ('5\' 1"'$ )   Body mass index is 37.41 kg/m.  Physical Exam Vitals reviewed.  Constitutional:  General: She is not in acute distress. Appearance: Normal appearance.  HENT:  Head: Normocephalic and atraumatic.  Right Ear: External ear normal.  Left Ear: External ear normal.  Nose: Nose normal.  Mouth/Throat:  Mouth: Mucous membranes are moist.  Pharynx: Oropharynx is clear.  Eyes:  General: No scleral icterus. Extraocular Movements: Extraocular movements intact.  Conjunctiva/sclera: Conjunctivae normal.  Pupils: Pupils are equal, round, and reactive to light.  Cardiovascular:  Rate and Rhythm: Normal rate and regular rhythm.  Pulses: Normal pulses.  Heart sounds: Normal heart sounds.  Pulmonary:  Effort: Pulmonary effort is normal. No respiratory distress.  Breath sounds: Normal breath sounds.  Abdominal:  General: Bowel sounds are normal.  Palpations: Abdomen is soft.  Tenderness: There is no abdominal tenderness.  Musculoskeletal:  General: No swelling, tenderness or deformity. Normal range of motion.  Cervical back: Normal range of motion and neck supple.  Skin: General: Skin is warm and dry.  Coloration: Skin is not jaundiced.  Neurological:  General: No focal deficit present.  Mental Status: She is alert and oriented to person, place,  and time.  Psychiatric:  Mood and Affect: Mood normal.  Behavior: Behavior normal.    Breast: The left upper outer breast scar has healed nicely with no sign of infection or seroma. There is a small area of induration with cellulitis in the lower outer quadrant of the right breast. Other than this  there is no other palpable mass in either breast. There is no palpable axillary, supraclavicular, or cervical lymphadenopathy.  Labs, Imaging and Diagnostic Testing:  Assessment and Plan:   Diagnoses and all orders for this visit:  Atypical lobular hyperplasia of left breast    The patient appears to have a 4 mm area of atypical lobular hyperplasia in the upper outer quadrant of the left breast with a history of ductal carcinoma in situ at the same site. Given her history and the appearance of atypical lobular hyperplasia my recommendation would be to have this area removed. She would also like to have this done. I have discussed with her in detail the risks and benefits of the operation as well as some of the technical aspects including the use of a radioactive seed for localization and she understands and wishes to proceed. She apparently refused radiation and could not tolerate antiestrogens 2 years ago. I did think she should meet again with oncology to see if it would be worth trying a different antiestrogen. I will make that referral for her. She also has a small superficial infection in the lower outer quadrant of the right breast. We will start her on doxycycline and see if this clears up. If it does not then she may need an I&D of the right breast at the same time she has her surgery on the left breast.

## 2022-08-19 NOTE — Anesthesia Preprocedure Evaluation (Addendum)
Anesthesia Evaluation  Patient identified by MRN, date of birth, ID band Patient awake    Reviewed: Allergy & Precautions, NPO status , Patient's Chart, lab work & pertinent test results  Airway Mallampati: III  TM Distance: >3 FB Neck ROM: Full    Dental  (+) Edentulous Upper, Missing, Dental Advisory Given   Pulmonary asthma , former smoker Quit smoking 03/2022 No inhalers   Pulmonary exam normal breath sounds clear to auscultation       Cardiovascular hypertension, Pt. on medications Normal cardiovascular exam Rhythm:Regular Rate:Normal     Neuro/Psych  PSYCHIATRIC DISORDERS Anxiety     negative neurological ROS     GI/Hepatic negative GI ROS, Neg liver ROS,,,  Endo/Other  negative endocrine ROS    Renal/GU negative Renal ROS  negative genitourinary   Musculoskeletal negative musculoskeletal ROS (+)    Abdominal   Peds  Hematology negative hematology ROS (+)   Anesthesia Other Findings L breast ca  Reproductive/Obstetrics negative OB ROS                             Anesthesia Physical Anesthesia Plan  ASA: 3  Anesthesia Plan: General   Post-op Pain Management: Tylenol PO (pre-op)*   Induction: Intravenous  PONV Risk Score and Plan: 3 and Ondansetron, Dexamethasone, Midazolam and Treatment may vary due to age or medical condition  Airway Management Planned: LMA  Additional Equipment: None  Intra-op Plan:   Post-operative Plan: Extubation in OR  Informed Consent: I have reviewed the patients History and Physical, chart, labs and discussed the procedure including the risks, benefits and alternatives for the proposed anesthesia with the patient or authorized representative who has indicated his/her understanding and acceptance.     Dental advisory given  Plan Discussed with: CRNA  Anesthesia Plan Comments:        Anesthesia Quick Evaluation

## 2022-08-19 NOTE — Discharge Instructions (Addendum)
  Post Anesthesia Home Care Instructions  Activity: Get plenty of rest for the remainder of the day. A responsible individual must stay with you for 24 hours following the procedure.  For the next 24 hours, DO NOT: -Drive a car -Paediatric nurse -Drink alcoholic beverages -Take any medication unless instructed by your physician -Make any legal decisions or sign important papers.  Meals: Start with liquid foods such as gelatin or soup. Progress to regular foods as tolerated. Avoid greasy, spicy, heavy foods. If nausea and/or vomiting occur, drink only clear liquids until the nausea and/or vomiting subsides. Call your physician if vomiting continues.  Special Instructions/Symptoms: Your throat may feel dry or sore from the anesthesia or the breathing tube placed in your throat during surgery. If this causes discomfort, gargle with warm salt water. The discomfort should disappear within 24 hours.  If you had a scopolamine patch placed behind your ear for the management of post- operative nausea and/or vomiting:  1. The medication in the patch is effective for 72 hours, after which it should be removed.  Wrap patch in a tissue and discard in the trash. Wash hands thoroughly with soap and water. 2. You may remove the patch earlier than 72 hours if you experience unpleasant side effects which may include dry mouth, dizziness or visual disturbances. 3. Avoid touching the patch. Wash your hands with soap and water after contact with the patch.     Can take Tylenol after 2PM

## 2022-08-19 NOTE — Anesthesia Postprocedure Evaluation (Signed)
Anesthesia Post Note  Patient: Veronica Conrad  Procedure(s) Performed: LEFT BREAST LUMPECTOMY WITH RADIOACTIVE SEED LOCALIZATION (Left: Breast)     Patient location during evaluation: PACU Anesthesia Type: General Level of consciousness: awake and alert, oriented and patient cooperative Pain management: pain level controlled Vital Signs Assessment: post-procedure vital signs reviewed and stable Respiratory status: spontaneous breathing, nonlabored ventilation and respiratory function stable Cardiovascular status: blood pressure returned to baseline and stable Postop Assessment: no apparent nausea or vomiting Anesthetic complications: no   No notable events documented.  Last Vitals:  Vitals:   08/19/22 0935 08/19/22 0945  BP: 122/87 94/72  Pulse: 83 86  Resp: 16 17  Temp: (!) 36.2 C   SpO2: 93% 92%    Last Pain:  Vitals:   08/19/22 0935  TempSrc:   PainSc: [place]

## 2022-08-19 NOTE — Op Note (Signed)
08/19/2022  9:21 AM  PATIENT:  Veronica Conrad  56 y.o. female  PRE-OPERATIVE DIAGNOSIS:  LEFT BREAST ALH  POST-OPERATIVE DIAGNOSIS:  LEFT BREAST ALH  PROCEDURE:  Procedure(s): LEFT BREAST LUMPECTOMY WITH RADIOACTIVE SEED LOCALIZATION (Left)  SURGEON:  Surgeon(s) and Role:    * Jovita Kussmaul, MD - Primary  PHYSICIAN ASSISTANT:   ASSISTANTS: none   ANESTHESIA:   local and general  EBL:  5 mL   BLOOD ADMINISTERED:none  DRAINS: none   LOCAL MEDICATIONS USED:  MARCAINE     SPECIMEN:  Source of Specimen:  left breast tissue  DISPOSITION OF SPECIMEN:  PATHOLOGY  COUNTS:  YES  TOURNIQUET:  * No tourniquets in log *  DICTATION: .Dragon Dictation  After informed consent was obtained the patient was brought to the operating room and placed in the supine position on the operating table.  After adequate induction of general anesthesia the patient's left breast was prepped with ChloraPrep, allowed to dry, and draped in usual sterile manner.  An appropriate timeout was performed.  Previously an I-125 seed was placed in the outer aspect of the left breast to mark an area of atypical lobular hyperplasia.  The neoprobe was set to I-125 in the area of radioactivity was readily identified.  The area around this was infiltrated with quarter percent [place].  A small transversely oriented incision was made overlying the area of radioactivity where her previous incision had been.  The incision was carried through the skin and subcutaneous tissue sharply with the electrocautery.  Dissection was then carried towards the radioactive seed under the direction of the neoprobe.  Once I more closely approached the radioactive seed I then removed a circular portion of breast tissue sharply with the electrocautery around the radioactive seed while checking the area of radioactivity frequently.  Once the specimen was removed it was oriented with the appropriate paint colors.  A specimen radiograph was  obtained that showed the clip and seed to be within the specimen.  The specimen was then sent to pathology for further evaluation.  Hemostasis was achieved using the Bovie electrocautery.  The wound was then irrigated with saline and infiltrated with more quarter percent Marcaine.  The deep layer of the wound was closed with layers of interrupted 3-0 Vicryl stitches.  The skin was then closed with interrupted 4-0 Monocryl subcuticular stitches.  Dermabond dressings were applied.  The patient tolerated the procedure well.  At the end of the case all needle sponge and instrument counts were correct.  The patient was then awakened and taken to recovery in stable condition.  PLAN OF CARE: Discharge to home after PACU  PATIENT DISPOSITION:  PACU - hemodynamically stable.   Delay start of Pharmacological VTE agent (>24hrs) due to surgical blood loss or risk of bleeding: not applicable

## 2022-08-19 NOTE — Anesthesia Procedure Notes (Signed)
Procedure Name: Intubation Date/Time: 08/19/2022 8:52 AM  Performed by: Glory Buff, CRNAPre-anesthesia Checklist: Patient identified, Emergency Drugs available, Suction available and Patient being monitored Patient Re-evaluated:Patient Re-evaluated prior to induction Oxygen Delivery Method: Circle system utilized Preoxygenation: Pre-oxygenation with 100% oxygen Induction Type: IV induction Ventilation: Mask ventilation without difficulty Laryngoscope Size: Miller and 3 Grade View: Grade I Tube type: Oral Tube size: 7.0 mm Number of attempts: 1 Airway Equipment and Method: Stylet and Oral airway Placement Confirmation: ETT inserted through vocal cords under direct vision, positive ETCO2 and breath sounds checked- equal and bilateral Secured at: 20 cm Tube secured with: Tape Dental Injury: Teeth and Oropharynx as per pre-operative assessment

## 2022-08-19 NOTE — Transfer of Care (Signed)
Immediate Anesthesia Transfer of Care Note  Patient: Veronica Conrad  Procedure(s) Performed: LEFT BREAST LUMPECTOMY WITH RADIOACTIVE SEED LOCALIZATION (Left: Breast)  Patient Location: PACU  Anesthesia Type:General  Level of Consciousness: drowsy and patient cooperative  Airway & Oxygen Therapy: Patient Spontanous Breathing and Patient connected to face mask oxygen  Post-op Assessment: Report given to RN and Post -op Vital signs reviewed and stable  Post vital signs: Reviewed and stable  Last Vitals:  Vitals Value Taken Time  BP 122/87 08/19/22 0933  Temp    Pulse 83 08/19/22 0933  Resp    SpO2 93 % 08/19/22 0933  Vitals shown include unvalidated device data.  Last Pain:  Vitals:   08/19/22 0749  TempSrc: Oral  PainSc: 0-No pain      Patients Stated Pain Goal: 3 (01/72/09 1068)  Complications: No notable events documented.

## 2022-08-19 NOTE — Interval H&P Note (Signed)
History and Physical Interval Note:  08/19/2022 8:33 AM  Veronica Conrad  has presented today for surgery, with the diagnosis of LEFT BREAST [place].  The various methods of treatment have been discussed with the patient and family. After consideration of risks, benefits and other options for treatment, the patient has consented to  Procedure(s): LEFT BREAST LUMPECTOMY WITH RADIOACTIVE SEED LOCALIZATION (Left) as a surgical intervention.  The patient's history has been reviewed, patient examined, no change in status, stable for surgery.  I have reviewed the patient's chart and labs.  Questions were answered to the patient's satisfaction.     Autumn Messing III

## 2022-08-22 ENCOUNTER — Encounter (HOSPITAL_BASED_OUTPATIENT_CLINIC_OR_DEPARTMENT_OTHER): Payer: Self-pay | Admitting: General Surgery

## 2022-08-22 NOTE — Progress Notes (Signed)
Left message stating courtesy call and if any questions or concerns please call the doctors office.  

## 2022-08-23 LAB — SURGICAL PATHOLOGY

## 2022-09-08 ENCOUNTER — Encounter: Payer: Self-pay | Admitting: Hematology and Oncology

## 2022-09-08 ENCOUNTER — Encounter (HOSPITAL_COMMUNITY): Payer: Self-pay

## 2022-09-26 ENCOUNTER — Encounter: Payer: 59 | Admitting: Hematology and Oncology

## 2022-10-22 ENCOUNTER — Ambulatory Visit
Admission: EM | Admit: 2022-10-22 | Discharge: 2022-10-22 | Disposition: A | Discharge status: Home / Self Care | Payer: 59 | Attending: Emergency Medicine | Admitting: Emergency Medicine

## 2022-10-22 DIAGNOSIS — J3089 Other allergic rhinitis: Secondary | ICD-10-CM | POA: Diagnosis not present

## 2022-10-22 DIAGNOSIS — J4520 Mild intermittent asthma, uncomplicated: Secondary | ICD-10-CM | POA: Diagnosis not present

## 2022-10-22 MED ORDER — DEXAMETHASONE SODIUM PHOSPHATE 10 MG/ML IJ SOLN
10.0000 mg | Freq: Once | INTRAMUSCULAR | Status: AC
  Administered 2022-10-22: 10 mg via INTRAMUSCULAR

## 2022-10-22 MED ORDER — CETIRIZINE HCL 10 MG PO TABS: 10.0000 mg | ORAL_TABLET | Freq: Every day | ORAL | 1 refills | Status: AC

## 2022-10-22 MED ORDER — MONTELUKAST SODIUM 10 MG PO TABS: 10.0000 mg | ORAL_TABLET | Freq: Every day | ORAL | 2 refills | Status: DC

## 2022-10-22 MED ORDER — FLUTICASONE PROPIONATE 50 MCG/ACT NA SUSP: 1.0000 | Freq: Every day | NASAL | 1 refills | Status: AC

## 2022-10-22 MED ORDER — ALBUTEROL SULFATE HFA 108 (90 BASE) MCG/ACT IN AERS: 2.0000 | INHALATION_SPRAY | Freq: Four times a day (QID) | RESPIRATORY_TRACT | 2 refills | Status: AC | PRN

## 2022-10-22 MED ORDER — MONTELUKAST SODIUM 10 MG PO TABS: 10.0000 mg | ORAL_TABLET | Freq: Every day | ORAL | 1 refills | Status: AC

## 2022-10-22 NOTE — ED Triage Notes (Signed)
Pt c/o sore throat, and congestion.   Started: since Thanksgiving   Home interventions: OTC medications

## 2022-10-22 NOTE — ED Provider Notes (Signed)
[facility]    CSN: [id number] Arrival date & time: 10/22/22  3016    HISTORY   Chief Complaint  Patient presents with   Shortness of Breath   Sore Throat   Nasal Congestion   HPI Veronica Conrad is a pleasant, 57 y.o. female who presents to urgent care today. Patient complains of scratchy throat, nasal congestion and rhinorrhea for the past 2 months.  Patient also reports nonproductive cough and occasional shortness of breath.  Patient states has been taking over-the-counter cold medications without relief.  Patient has a history of allergies and asthma, not currently taking any allergy or asthma medication.    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   Family history of colon cancer    Malignant neoplasm of upper-outer quadrant of left breast in female, estrogen receptor positive ([place]) 03/18/2020   Cervical cancer ([place]) 2015   Past Surgical History:  Procedure Laterality Date   ABDOMINAL HYSTERECTOMY     BREAST BIOPSY Left 02/14/2020   BREAST BIOPSY Left 03/16/2020   BREAST BIOPSY  08/18/2022   MM LT RADIOACTIVE SEED LOC MAMMO GUIDE 08/18/2022 [facility] MAMMOGRAPHY   BREAST LUMPECTOMY Left 04/10/2020   BREAST LUMPECTOMY WITH RADIOACTIVE SEED LOCALIZATION Left 04/10/2020   Procedure: LEFT BREAST LUMPECTOMY WITH RADIOACTIVE SEED LOCALIZATION;  Surgeon: Alphonsa Overall, MD;  Location: [place];  Service: General;  Laterality: Left;   BREAST LUMPECTOMY WITH RADIOACTIVE SEED LOCALIZATION Left 08/19/2022   Procedure: LEFT BREAST LUMPECTOMY WITH RADIOACTIVE SEED LOCALIZATION;  Surgeon: Jovita Kussmaul, MD;  Location: [place];  Service: General;   Laterality: Left;   OB History     Gravida  3   Para      Term      Preterm      AB      Living         SAB      IAB      Ectopic      Multiple      Live Births  3          Home Medications    Prior to Admission medications   Medication Sig Start Date End Date Taking? Authorizing Provider  amLODipine (NORVASC) 5 MG tablet Take 5 mg by mouth daily. 06/01/21   [provider]  Cetirizine HCl (ZYRTEC PO) Take by mouth.    [provider]  fluticasone (FLONASE) 50 MCG/ACT nasal spray Place into both nostrils daily.    [provider]  oxyCODONE (ROXICODONE) 5 MG immediate release tablet Take 1 tablet (5 mg total) by mouth every 6 (six) hours as needed for severe pain. 08/19/22   Autumn Messing III, MD  QUEtiapine (SEROQUEL) 100 MG tablet Take 100 mg by mouth at bedtime. 04/27/21   [provider]    Family History Family History  Problem Relation Age of Onset   Breast cancer Mother 64   Hypertension Mother    Heart disease Mother    High Cholesterol Mother    Birth defects Mother    Other Mother  adopted   Heart Problems Father        pacemaker   Colon cancer Paternal Uncle        dx. in his 31s   Diabetes Maternal Grandfather    Colon polyps Neg Hx    Esophageal cancer Neg Hx    Stomach cancer Neg Hx    Rectal cancer Neg Hx    Social History Social History   Tobacco Use   Smoking status: Former    Types: Cigarettes    Quit date: 04/06/2022    Years since quitting: 0.5   Smokeless tobacco: Never  Vaping Use   Vaping Use: Never used  Substance Use Topics   Alcohol use: Yes    Comment: 2 wine per day   Drug use: Never   Allergies   Aromasin [exemestane]  Review of Systems Review of Systems Pertinent findings revealed after performing a 14 point review of systems has been noted in the history of present illness.  Physical Exam Triage Vital Signs ED Triage Vitals  Enc Vitals Group     BP 08/06/21 0827  (!) 147/82     Pulse Rate 08/06/21 0827 72     Resp 08/06/21 0827 18     Temp 08/06/21 0827 98.3 F (36.8 C)     Temp Source 08/06/21 0827 Oral     SpO2 08/06/21 0827 98 %     Weight --      Height --      Head Circumference --      Peak Flow --      Pain Score 08/06/21 0826 5     Pain Loc --      Pain Edu? --      Excl. in [place]? --   No data found.  Updated Vital Signs BP 124/89 (BP Location: Left Arm)   Pulse (!) 106   Resp 18   SpO2 95%   Physical Exam Vitals and nursing note reviewed.  Constitutional:      General: She is not in acute distress.    Appearance: Normal appearance. She is not ill-appearing.  HENT:     Head: Normocephalic and atraumatic.     Salivary Glands: Right salivary gland is not diffusely enlarged or tender. Left salivary gland is not diffusely enlarged or tender.     Right Ear: Ear canal and external ear normal. No drainage. A middle ear effusion is present. There is no impacted cerumen. Tympanic membrane is bulging. Tympanic membrane is not injected or erythematous.     Left Ear: Ear canal and external ear normal. No drainage. A middle ear effusion is present. There is no impacted cerumen. Tympanic membrane is bulging. Tympanic membrane is not injected or erythematous.     Ears:     Comments: Bilateral EACs normal, both TMs bulging with clear fluid    Nose: Rhinorrhea present. No nasal deformity, septal deviation, signs of injury, nasal tenderness, mucosal edema or congestion. Rhinorrhea is clear.     Right Nostril: Occlusion present. No foreign body, epistaxis or septal hematoma.     Left Nostril: Occlusion present. No foreign body, epistaxis or septal hematoma.     Right Turbinates: Enlarged, swollen and pale.     Left Turbinates: Enlarged, swollen and pale.     Right Sinus: No maxillary sinus tenderness or frontal sinus tenderness.     Left Sinus: No maxillary sinus tenderness or frontal sinus tenderness.     Mouth/Throat:     Lips: Pink. No  lesions.     Mouth: Mucous membranes are moist. No oral lesions.     Pharynx: Oropharynx is clear. Uvula midline. No posterior oropharyngeal erythema or uvula swelling.     Tonsils: No tonsillar exudate. 0 on the right. 0 on the left.     Comments: Postnasal drip Eyes:     General: Lids are normal.        Right eye: No discharge.        Left eye: No discharge.     Extraocular Movements: Extraocular movements intact.     Conjunctiva/sclera: Conjunctivae normal.     Right eye: Right conjunctiva is not injected.     Left eye: Left conjunctiva is not injected.  Neck:     Trachea: Trachea and phonation normal.  Cardiovascular:     Rate and Rhythm: Normal rate and regular rhythm.     Pulses: Normal pulses.     Heart sounds: Normal heart sounds. No murmur heard.    No friction rub. No gallop.  Pulmonary:     Effort: Pulmonary effort is normal. Prolonged expiration present. No tachypnea, bradypnea, accessory muscle usage, respiratory distress or retractions.     Breath sounds: No stridor, decreased air movement or transmitted upper airway sounds. Examination of the right-upper field reveals decreased breath sounds. Examination of the left-upper field reveals decreased breath sounds. Examination of the right-middle field reveals decreased breath sounds. Examination of the left-middle field reveals decreased breath sounds. Examination of the right-lower field reveals decreased breath sounds. Examination of the left-lower field reveals decreased breath sounds. Decreased breath sounds present. No wheezing, rhonchi or rales.  Chest:     Chest wall: No tenderness.  Musculoskeletal:        General: Normal range of motion.     Cervical back: Normal range of motion and neck supple. Normal range of motion.  Lymphadenopathy:     Cervical: No cervical adenopathy.  Skin:    General: Skin is warm and dry.     Findings: No erythema or rash.  Neurological:     General: No focal deficit present.     Mental  Status: She is alert and oriented to person, place, and time.  Psychiatric:        Mood and Affect: Mood normal.        Behavior: Behavior normal.     Visual Acuity Right Eye Distance:   Left Eye Distance:   Bilateral Distance:    Right Eye Near:   Left Eye Near:    Bilateral Near:     UC Couse / Diagnostics / Procedures:     Radiology No results found.  Procedures Procedures (including critical care time) EKG  Pending results:  Labs Reviewed - No data to display  Medications Ordered in UC: Medications  dexamethasone (DECADRON) injection 10 mg (has no administration in time range)    UC Diagnoses / Final Clinical Impressions(s)   I have reviewed the triage vital signs and the nursing notes.  Pertinent labs & imaging results that were available during my care of the patient were reviewed by me and considered in my medical decision making (see chart for details).    Final diagnoses:  Mild intermittent asthma, unspecified whether complicated  Perennial allergic rhinitis   Patient provided with a Decadron injection during her visit today for rapid relief of her symptoms.  Patient abided with renewals of her allergy and asthma medications.  Return precautions advised. Please see discharge instructions below for further details of plan  of care as provided to patient. ED Prescriptions     Medication Sig Dispense Auth. Provider   cetirizine (ZYRTEC ALLERGY) 10 MG tablet Take 1 tablet (10 mg total) by mouth at bedtime. 90 tablet Lynden Oxford Scales, PA-C   fluticasone (FLONASE) 50 MCG/ACT nasal spray Place 1 spray into both nostrils daily. 47.4 mL Lynden Oxford Scales, PA-C   montelukast (SINGULAIR) 10 MG tablet  (Status: Discontinued) Take 1 tablet (10 mg total) by mouth at bedtime. 30 tablet Lynden Oxford Scales, PA-C   albuterol (VENTOLIN HFA) 108 (90 Base) MCG/ACT inhaler Inhale 2 puffs into the lungs every 6 (six) hours as needed for wheezing or shortness of  breath (Cough). 54 g Lynden Oxford Scales, PA-C   montelukast (SINGULAIR) 10 MG tablet Take 1 tablet (10 mg total) by mouth at bedtime. 90 tablet Lynden Oxford Scales, [place]      PDMP not reviewed this encounter.  Disposition Upon Discharge:  Condition: stable for discharge home Home: take medications as prescribed; routine discharge instructions as discussed; follow up as advised.  Patient presented with an acute illness with associated systemic symptoms and significant discomfort requiring urgent management. In my opinion, this is a condition that a prudent lay person (someone who possesses an average knowledge of health and medicine) may potentially expect to result in complications if not addressed urgently such as respiratory distress, impairment of bodily function or dysfunction of bodily organs.   Routine symptom specific, illness specific and/or disease specific instructions were discussed with the patient and/or caregiver at length.   As such, the patient has been evaluated and assessed, work-up was performed and treatment was provided in alignment with urgent care protocols and evidence based medicine.  Patient/parent/caregiver has been advised that the patient may require follow up for further testing and treatment if the symptoms continue in spite of treatment, as clinically indicated and appropriate.  If the patient was tested for COVID-19, Influenza and/or RSV, then the patient/parent/guardian was advised to isolate at home pending the results of his/her diagnostic coronavirus test and potentially longer if they're positive. I have also advised pt that if his/her COVID-19 test returns positive, it's recommended to self-isolate for at least 10 days after symptoms first appeared AND until fever-free for 24 hours without fever reducer AND other symptoms have improved or resolved. Discussed self-isolation recommendations as well as instructions for household member/close contacts as per  the [facility] and  [facility], and also gave patient the [place] packet with this information.  Patient/parent/caregiver has been advised to return to the [facility] or PCP in 3-5 days if no better; to PCP or the Emergency Department if new signs and symptoms develop, or if the current signs or symptoms continue to change or worsen for further workup, evaluation and treatment as clinically indicated and appropriate  The patient will follow up with their current PCP if and as advised. If the patient does not currently have a PCP we will assist them in obtaining one.   The patient may need specialty follow up if the symptoms continue, in spite of conservative treatment and management, for further workup, evaluation, consultation and treatment as clinically indicated and appropriate.  Patient/parent/caregiver verbalized understanding and agreement of plan as discussed.  All questions were addressed during visit.  Please see discharge instructions below for further details of plan.  Discharge Instructions:   Discharge Instructions      Your symptoms and my physical exam findings are concerning for exacerbation of your underlying allergies and asthma.  To avoid catching frequent respiratory infections, having skin reactions, dealing with eye irritation, losing sleep, missing work, etc., due to uncontrolled allergies, it is important that you begin/continue your allergy regimen and are consistent with taking your meds exactly as prescribed.   Please see the list below for recommended medications, dosages and frequencies to provide relief of current symptoms:     Decadron IM (dexamethasone):  To quickly address your significant respiratory inflammation, you were provided with an injection of Decadron in the office today.  You should continue to feel the full benefit of the steroid for the next 12-24 hours.    Zyrtec (cetirizine): This is an excellent second-generation antihistamine that helps to reduce respiratory  inflammatory response to environmental allergens.  In some patients, this medication can cause daytime sleepiness so I recommend that you take 1 tablet daily at bedtime.     Singulair (montelukast): This is a mast cell stabilizer that works well with antihistamines.  Mast cells are responsible for stimulating histamine production so you can imagine that if we can reduce the activity of your mast cells, then fewer histamines will be produced and inflammation caused by allergy exposure will be significantly reduced.  I recommend that you take this medication at the same time you take your antihistamine.   Flonase (fluticasone): This is a steroid nasal spray that you use once daily, 1 spray in each nare.  This medication does not work well if you decide to use it only used as you feel you need to, it works best used on a daily basis.  After 3 to 5 days of use, you will notice significant reduction of the inflammation and mucus production that is currently being caused by exposure to allergens, whether seasonal or environmental.  The most common side effect of this medication is nosebleeds.  If you experience a nosebleed, please discontinue use for 1 week, then feel free to resume.  I have provided you with a prescription.     ProAir, Ventolin, Proventil (albuterol): This inhaled medication contains a short acting beta agonist bronchodilator.  This medication works on the smooth muscle that opens and constricts of your airways by relaxing the muscle.  The result of relaxation of the smooth muscle is increased air movement and improved work of breathing.  This is a short acting medication that can be used every 4-6 hours as needed for increased work of breathing, shortness of breath, wheezing and excessive coughing.  I have provided you with a prescription.    If you find that you have not had improvement of your symptoms in the next 5 to 7 days, please follow-up with your primary care provider or return here to  urgent care for repeat evaluation and further recommendations.   Thank you for visiting urgent care today.  We appreciate the opportunity to participate in your care.       This office note has been dictated using Museum/gallery curator.  Unfortunately, this method of dictation can sometimes lead to typographical or grammatical errors.  I apologize for your inconvenience in advance if this occurs.  Please do not hesitate to reach out to me if clarification is needed.      Lynden Oxford Scales, [place] 10/22/22 [phone]

## 2022-10-22 NOTE — Discharge Instructions (Signed)
Your symptoms and my physical exam findings are concerning for exacerbation of your underlying allergies and asthma.    To avoid catching frequent respiratory infections, having skin reactions, dealing with eye irritation, losing sleep, missing work, etc., due to uncontrolled allergies, it is important that you begin/continue your allergy regimen and are consistent with taking your meds exactly as prescribed.   Please see the list below for recommended medications, dosages and frequencies to provide relief of current symptoms:     Decadron IM (dexamethasone):  To quickly address your significant respiratory inflammation, you were provided with an injection of Decadron in the office today.  You should continue to feel the full benefit of the steroid for the next 12-24 hours.    Zyrtec (cetirizine): This is an excellent second-generation antihistamine that helps to reduce respiratory inflammatory response to environmental allergens.  In some patients, this medication can cause daytime sleepiness so I recommend that you take 1 tablet daily at bedtime.     Singulair (montelukast): This is a mast cell stabilizer that works well with antihistamines.  Mast cells are responsible for stimulating histamine production so you can imagine that if we can reduce the activity of your mast cells, then fewer histamines will be produced and inflammation caused by allergy exposure will be significantly reduced.  I recommend that you take this medication at the same time you take your antihistamine.   Flonase (fluticasone): This is a steroid nasal spray that you use once daily, 1 spray in each nare.  This medication does not work well if you decide to use it only used as you feel you need to, it works best used on a daily basis.  After 3 to 5 days of use, you will notice significant reduction of the inflammation and mucus production that is currently being caused by exposure to allergens, whether seasonal or environmental.   The most common side effect of this medication is nosebleeds.  If you experience a nosebleed, please discontinue use for 1 week, then feel free to resume.  I have provided you with a prescription.     ProAir, Ventolin, Proventil (albuterol): This inhaled medication contains a short acting beta agonist bronchodilator.  This medication works on the smooth muscle that opens and constricts of your airways by relaxing the muscle.  The result of relaxation of the smooth muscle is increased air movement and improved work of breathing.  This is a short acting medication that can be used every 4-6 hours as needed for increased work of breathing, shortness of breath, wheezing and excessive coughing.  I have provided you with a prescription.    If you find that you have not had improvement of your symptoms in the next 5 to 7 days, please follow-up with your primary care provider or return here to urgent care for repeat evaluation and further recommendations.   Thank you for visiting urgent care today.  We appreciate the opportunity to participate in your care.

## 2022-12-06 ENCOUNTER — Institutional Professional Consult (permissible substitution): Payer: 59 | Admitting: Plastic Surgery
